# Patient Record
Sex: Male | Born: 2000 | Race: Black or African American | Hispanic: No | Marital: Single | Smoking: Former smoker
Health system: Southern US, Community
[De-identification: ages and names within clinical notes are randomized; demographics above are authoritative.]

## PROBLEM LIST (undated history)

## (undated) DIAGNOSIS — J309 Allergic rhinitis, unspecified: Secondary | ICD-10-CM

## (undated) DIAGNOSIS — F32A Depression, unspecified: Secondary | ICD-10-CM

## (undated) DIAGNOSIS — F319 Bipolar disorder, unspecified: Secondary | ICD-10-CM

## (undated) DIAGNOSIS — M2550 Pain in unspecified joint: Secondary | ICD-10-CM

## (undated) DIAGNOSIS — M549 Dorsalgia, unspecified: Secondary | ICD-10-CM

## (undated) DIAGNOSIS — F419 Anxiety disorder, unspecified: Secondary | ICD-10-CM

## (undated) DIAGNOSIS — F909 Attention-deficit hyperactivity disorder, unspecified type: Secondary | ICD-10-CM

## (undated) DIAGNOSIS — R0602 Shortness of breath: Secondary | ICD-10-CM

## (undated) DIAGNOSIS — Z789 Other specified health status: Secondary | ICD-10-CM

## (undated) DIAGNOSIS — K259 Gastric ulcer, unspecified as acute or chronic, without hemorrhage or perforation: Secondary | ICD-10-CM

## (undated) DIAGNOSIS — K219 Gastro-esophageal reflux disease without esophagitis: Secondary | ICD-10-CM

## (undated) HISTORY — DX: Attention-deficit hyperactivity disorder, unspecified type: F90.9

## (undated) HISTORY — DX: Gastric ulcer, unspecified as acute or chronic, without hemorrhage or perforation: K25.9

## (undated) HISTORY — DX: Allergic rhinitis, unspecified: J30.9

## (undated) HISTORY — DX: Shortness of breath: R06.02

## (undated) HISTORY — DX: Dorsalgia, unspecified: M54.9

## (undated) HISTORY — DX: Other specified health status: Z78.9

## (undated) HISTORY — DX: Depression, unspecified: F32.A

## (undated) HISTORY — DX: Pain in unspecified joint: M25.50

## (undated) HISTORY — DX: Gastro-esophageal reflux disease without esophagitis: K21.9

## (undated) HISTORY — DX: Anxiety disorder, unspecified: F41.9

---
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
## 2018-01-14 ENCOUNTER — Other Ambulatory Visit: Payer: Self-pay | Admitting: Family Medicine

## 2018-03-06 ENCOUNTER — Encounter: Payer: Self-pay | Admitting: Family Medicine

## 2018-03-06 ENCOUNTER — Ambulatory Visit (INDEPENDENT_AMBULATORY_CARE_PROVIDER_SITE_OTHER): Payer: 59 | Admitting: Family Medicine

## 2018-03-06 ENCOUNTER — Telehealth: Payer: Self-pay | Admitting: Family Medicine

## 2018-03-06 VITALS — BP 130/84 | HR 91 | Temp 99.3°F | Ht 72.77 in | Wt 209.8 lb

## 2018-03-06 DIAGNOSIS — J069 Acute upper respiratory infection, unspecified: Secondary | ICD-10-CM

## 2018-03-06 DIAGNOSIS — R509 Fever, unspecified: Secondary | ICD-10-CM

## 2018-03-06 DIAGNOSIS — F3181 Bipolar II disorder: Secondary | ICD-10-CM | POA: Diagnosis not present

## 2018-03-06 LAB — POC INFLUENZA A&B (BINAX/QUICKVUE)
INFLUENZA B, POC: NEGATIVE
Influenza A, POC: NEGATIVE

## 2018-03-06 MED ORDER — BENZONATATE 100 MG PO CAPS: 100.0000 mg | ORAL_CAPSULE | Freq: Two times a day (BID) | ORAL | 0 refills | Status: DC | PRN

## 2018-03-06 NOTE — Telephone Encounter (Signed)
See note  Copied from CRM [id number]. Topic: General - Inquiry >> Mar 06, 2018  1:51 PM Maia Petties wrote: Reason for CRM: pt called stating that his girlfriend, 20 yrs old, has cystic fibrosis and he is asking if it would be possible to find out what type of bacterial infection he may have and if it would be problematic to be around her. Please advise.

## 2018-03-06 NOTE — Patient Instructions (Addendum)
Upper Respiratory infection History and exam today are suggestive of viral infection most likely due to upper respiratory infection. Flu test negative today  Symptomatic treatment with: tessalon perles for cough. I also want you to try to rest in bed over 10 hours per day and try to remain very well hydrated.   We discussed that we did not find any infection that had higher probability of being bacterial such as pneumonia or strep throat.  We discussed signs that bacterial infection may have developed particularly fever or shortness of breath. Likely course of 1-2 weeks. Patient is contagious and advised good handwashing and consideration of mask If going to be in public places.   Finally, we reviewed reasons to return to care including if symptoms worsen or persist (lets say not improving within a week- we should check back in) or new concerns arise- once again particularly shortness of breath or fever.  Since you are going back to [facility] on Thursday- you can also reach out to me by mychart or phone in about a week if not improving and update me with current symptoms- that may ultimately change the plan

## 2018-03-06 NOTE — Progress Notes (Signed)
PCP: Shelva Majestic, MD  Subjective:  Cory [name] is a 18 y.o. year old very pleasant male patient who presents with Upper Respiratory infection symptoms including nasal congestion- clear to yellow color, sore throat but he states from coughing, cough- getting clear to yellow sputum out, sneezing. Some body aches. Also really tired with this. Subjective fevers. n ochills.  -started: 2 days ago, symptoms show no change -previous treatments: mucinex. Tried vicks vapo rub which seemed to help -sick contacts/travel/risks: denies flu exposure.  -Hx of: allergies- still taking his zyrtec.   ROS-denies measured fever, SOB, vomiting or diarrhea, tooth pain  Pertinent Past Medical History-  Patient Active Problem List   Diagnosis Date Noted  . Bipolar II disorder (HCC) 07/25/2016    Priority: High  . ADHD     Priority: High  . Electronic cigarette use     Priority: Medium  . Anxiety     Priority: Medium  . GERD (gastroesophageal reflux disease)     Priority: Low  . Instability of right knee joint 08/06/2016   Medications- reviewed  Current Outpatient Medications  Medication Sig Dispense Refill  . cetirizine (ZYRTEC) 10 MG tablet Take 10 mg by mouth daily with breakfast.    . gabapentin (NEURONTIN) 300 MG capsule Take 1 capsule by mouth at bedtime as needed.  1  . lamoTRIgine (LAMICTAL) 200 MG tablet Take 200 mg by mouth daily.    . montelukast (SINGULAIR) 10 MG tablet Take 10 mg by mouth daily with breakfast.    . omeprazole (PRILOSEC) 20 MG capsule TAKE 1 CAPSULE (20 MG TOTAL) BY MOUTH DAILY WITH BREAKFAST. 30 capsule 0  . traZODone (DESYREL) 50 MG tablet Take 50 mg by mouth at bedtime.    . benzonatate (TESSALON) 100 MG capsule Take 1 capsule (100 mg total) by mouth 2 (two) times daily as needed for cough. 20 capsule 0   No current facility-administered medications for this visit.     Objective: BP 130/84 (BP Location: Left Arm, Patient Position: Sitting, Cuff Size: Large)    Pulse 91   Temp 99.3 F (37.4 C) (Oral)   Ht 6' 0.77" (1.848 m)   Wt 209 lb 12.8 oz (95.2 kg)   SpO2 96%   BMI 27.86 kg/m  Gen: NAD, resting comfortably HEENT: Turbinates erythematous and particularly on right extremely edematous, TM normal, pharynx mildly erythematous with no tonsilar exudate or edema, no sinus tenderness CV: RRR no murmurs rubs or gallops Lungs: CTAB no crackles, wheeze, rhonchi Ext: no edema Skin: warm, dry, no rash Neuro: grossly normal, moves all extremities  Results for orders placed or performed in visit on 03/06/18 (from the past 24 hour(s))  POC Influenza A&B(BINAX/QUICKVUE)     Status: None   Collection Time: 03/06/18 11:10 AM  Result Value Ref Range   Influenza A, POC Negative Negative   Influenza B, POC Negative Negative   Assessment/Plan:  Upper Respiratory infection History and exam today are suggestive of viral infection most likely due to upper respiratory infection. Flu test negative today  Symptomatic treatment with: tessalon perles for cough. I also want you to try to rest in bed over 10 hours per day and try to remain very well hydrated.   Considered prednisone but with psychiatric history of bipolar 2 wanted to reserve this if patient is not improving- he is already not sleeping well with illness and thought prednisone may worsen this.   We discussed that we did not find any infection that had higher  probability of being bacterial such as pneumonia or strep throat.  We discussed signs that bacterial infection may have developed particularly fever or shortness of breath. Likely course of 1-2 weeks. Patient is contagious and advised good handwashing and consideration of mask If going to be in public places.   Finally, we reviewed reasons to return to care including if symptoms worsen or persist (lets say not improving within a week- we should check back in) or new concerns arise- once again particularly shortness of breath or fever.  Since you are  going back to [facility] on Thursday- you can also reach out to me by mychart or phone in about a week if not improving and update me with current symptoms- that may ultimately change the plan.   Meds ordered this encounter  Medications  . benzonatate (TESSALON) 100 MG capsule    Sig: Take 1 capsule (100 mg total) by mouth 2 (two) times daily as needed for cough.    Dispense:  20 capsule    Refill:  0   Tana Conch, MD

## 2018-03-07 NOTE — Telephone Encounter (Signed)
He should avoid contact until illness has resolved. We did NOTdiagnose him with a bacterial infection- instead diagnosed with viral upper respiratory infection

## 2018-03-09 NOTE — Telephone Encounter (Signed)
Called but did not leave a voicemail message as the number listed is the dads telephone number

## 2018-04-25 ENCOUNTER — Other Ambulatory Visit: Payer: Self-pay | Admitting: Family Medicine

## 2018-04-28 ENCOUNTER — Other Ambulatory Visit: Payer: Self-pay

## 2018-06-21 ENCOUNTER — Encounter: Payer: Self-pay | Admitting: Family Medicine

## 2018-06-21 ENCOUNTER — Ambulatory Visit (INDEPENDENT_AMBULATORY_CARE_PROVIDER_SITE_OTHER): Payer: 59 | Admitting: Family Medicine

## 2018-06-21 VITALS — Ht 73.0 in | Wt 210.0 lb

## 2018-06-21 DIAGNOSIS — Z6827 Body mass index (BMI) 27.0-27.9, adult: Secondary | ICD-10-CM

## 2018-06-21 DIAGNOSIS — R110 Nausea: Secondary | ICD-10-CM

## 2018-06-21 DIAGNOSIS — J301 Allergic rhinitis due to pollen: Secondary | ICD-10-CM

## 2018-06-21 DIAGNOSIS — K219 Gastro-esophageal reflux disease without esophagitis: Secondary | ICD-10-CM

## 2018-06-21 DIAGNOSIS — Z789 Other specified health status: Secondary | ICD-10-CM

## 2018-06-21 DIAGNOSIS — J309 Allergic rhinitis, unspecified: Secondary | ICD-10-CM | POA: Insufficient documentation

## 2018-06-21 DIAGNOSIS — E663 Overweight: Secondary | ICD-10-CM

## 2018-06-21 DIAGNOSIS — F3181 Bipolar II disorder: Secondary | ICD-10-CM

## 2018-06-21 MED ORDER — OMEPRAZOLE 20 MG PO CPDR: 20.0000 mg | DELAYED_RELEASE_CAPSULE | Freq: Every day | ORAL | 3 refills | Status: DC

## 2018-06-21 MED ORDER — FLUTICASONE PROPIONATE 50 MCG/ACT NA SUSP: 2.0000 | Freq: Every day | NASAL | 3 refills | Status: DC

## 2018-06-21 NOTE — Patient Instructions (Addendum)
Health Maintenance Due  Topic Date Due  . HIV Screening Pt stated that he is willing to have this done and also willing to postponed for later visit 11/10/2013   Pt did not receive a flu shot this flu season  Video visit

## 2018-06-21 NOTE — Progress Notes (Signed)
Phq2 done! Pt had a high score. Advised pt to f/u with psych per Dr. Erasmo Leventhal request.   Please see Phq-9

## 2018-06-21 NOTE — Progress Notes (Signed)
Elevated PHQ 9 score noted-current COVID-19 pandemic likely contributes in fact he has to be home from college.  With that being said-needs close psychiatry follow-up as recommended by Kendra-I appreciate her calling patient back.   Office Visit from 06/21/2018 in [facility]  PHQ-9 Total Score  12     Tana Conch, MD

## 2018-06-21 NOTE — Assessment & Plan Note (Signed)
S: Patient states bipolar has been reasonably well controlled.  He continues follow-up psychiatry.  He states he has about a week left of medication but needs a refill on the Lamictal and requests a refill from me. A/P: I recommended patient call psychiatry for follow-up-he agrees to do so-he states sometimes can take up to a week to get his medication refilled-I recommended he start on that today. -Due to potential mood issues/blackbox warning with Singulair-we opted to try to discontinue this.

## 2018-06-21 NOTE — Progress Notes (Signed)
Phone [phone]   Subjective:  Virtual visit via Video note. Chief complaint: Chief Complaint  Patient presents with  . Nausea    Pt stated that he has been having nausea and head aches  . Medication Refill    Lamactil this was not prescribed by you    This visit type was conducted due to national recommendations for restrictions regarding the COVID-19 Pandemic (e.g. social distancing).  This format is felt to be most appropriate for this patient at this time balancing risks to patient and risks to population by having him in for in person visit.  No physical exam was performed (except for noted visual exam or audio findings with Telehealth visits).    Our team/I connected with Cory [name] on 06/21/18 at 11:40 AM EDT by a video enabled telemedicine application ([email or web address]) and verified that I am speaking with the correct person using two identifiers.  Location patient: Home[place] Location provider: [facility], office Persons participating in the virtual visit:  patient  Our team/I discussed the limitations of evaluation and management by telemedicine and the availability of in person appointments. In light of current covid-19 pandemic, patient also understands that we are trying to protect them by minimizing in office contact if at all possible.  The patient expressed consent for telemedicine visit and agreed to proceed. Patient understands insurance will be billed.   ROS-no fever, chills, vomiting.  Has had some nausea and upper abdominal discomfort/chest burning particular after meals.  Past Medical History-  Patient Active Problem List   Diagnosis Date Noted  . Bipolar II disorder (H[facility]) 07/25/2016    Priority: High  . ADHD     Priority: High  . Electronic cigarette use     Priority: Medium  . Anxiety     Priority: Medium  . GERD (gastroesophageal reflux disease)     Priority: Low  . Allergic rhinitis 06/21/2018  . Instability of right knee joint 08/06/2016    Medications-  reviewed and updated Current Outpatient Medications  Medication Sig Dispense Refill  . cetirizine (ZYRTEC) 10 MG tablet Take 10 mg by mouth daily with breakfast.    . gabapentin (NEURONTIN) 300 MG capsule Take 1 capsule by mouth at bedtime as needed.  1  . lamoTRIgine (LAMICTAL) 200 MG tablet Take 200 mg by mouth daily.    . montelukast (SINGULAIR) 10 MG tablet TAKE 1 TABLET (10 MG TOTAL) BY MOUTH DAILY. 90 tablet 0  . omeprazole (PRILOSEC) 20 MG capsule Take 1 capsule (20 mg total) by mouth daily with breakfast. 90 capsule 3  . traZODone (DESYREL) 50 MG tablet Take 50 mg by mouth at bedtime.    . fluticasone (FLONASE) 50 MCG/ACT nasal spray Place 2 sprays into both nostrils daily. 48 g 3   No current facility-administered medications for this visit.      Objective:  Ht 6\' 1"  (1.854 m)   Wt 210 lb (95.3 kg)   BMI 27.71 kg/m  Gen: NAD, resting comfortably-does appear sleepy-yawns during visit Lungs: nonlabored, normal respiratory rate  Skin: appears dry, no obvious rash    Assessment and Plan   #GERD/nausea S:Patient states over last 1.5 weeks has been getting very nauseous on and off and then over last few days has been almost constantly nauseous. If he lays still seems to get better. When acid reflux was bad when he was younger it would cause similar issues- would throw up.   Also having some headaches for last month.  Possible contributing factor-crunch time with  school- exams coming up- 28th and 5th.   Has not refilled his omeprazole - stopped 2-3 weeks ago.  Usually takes before dinner A/P: Given the fact that nausea started almost 2 weeks ago when patient stopped his omeprazole 2 to 3 weeks ago- in addition to the fact that in the past his GERD has presented with nausea and current symptoms seem to be worse after meals-I recommended he restart the omeprazole as GERD could be the cause of his symptoms-he states he needs a refill.  We refill this today. -Recommended 3-4-week  follow-up if not improving-sooner if worsens  #Overweight S: Patient states he had gotten all the way down to 195 but has gone back up to 210-he is exercising regularly but admits to eating poorly with sandwiches/pasta/a lot of carbohydrates.  Compared to last check here weight is only up 1 pound.  He asks about weight loss medication. A/P: Instead of starting weight loss medication-I recommended patient improve upon his diet-particularly with increasing fruits and vegetables and cutting down on heavy carbohydrate load.  He agrees to work on this as well as portion size.   Bipolar II disorder (HCC) S: Patient states bipolar has been reasonably well controlled.  He continues follow-up psychiatry.  He states he has about a week left of medication but needs a refill on the Lamictal and requests a refill from me. A/P: I recommended patient call psychiatry for follow-up-he agrees to do so-he states sometimes can take up to a week to get his medication refilled-I recommended he start on that today. -Due to potential mood issues/blackbox warning with Singulair-we opted to try to discontinue this.  Electronic cigarette use S: Continues to use e-cigarettes. A/P: I recommended patient discontinue use-particularly in light of COVID-19 pandemic-he states he will try to cut back/quit  Allergic rhinitis S: Patient states allergies have been well controlled on Singulair and Zyrtec. A/P: Due to potential mood altering symptoms of Singulair I recommended he stop this in light of bipolar.  Continue Zyrtec.  I also sent in Flonase for him if symptoms are not adequately controlled on Zyrtec alone.   Lab/Order associations: Gastroesophageal reflux disease without esophagitis  Bipolar II disorder (HCC)  Electronic cigarette use  Overweight  BMI 27.0-27.9,adult  Seasonal allergic rhinitis due to pollen  Meds ordered this encounter  Medications  . omeprazole (PRILOSEC) 20 MG capsule    Sig: Take 1 capsule  (20 mg total) by mouth daily with breakfast.    Dispense:  90 capsule    Refill:  3  . fluticasone (FLONASE) 50 MCG/ACT nasal spray    Sig: Place 2 sprays into both nostrils daily.    Dispense:  48 g    Refill:  3   Return precautions advised.  Tana Conch, MD

## 2018-06-21 NOTE — Assessment & Plan Note (Signed)
S: Patient states allergies have been well controlled on Singulair and Zyrtec. A/P: Due to potential mood altering symptoms of Singulair I recommended he stop this in light of bipolar.  Continue Zyrtec.  I also sent in Flonase for him if symptoms are not adequately controlled on Zyrtec alone.

## 2018-06-21 NOTE — Assessment & Plan Note (Signed)
S: Continues to use e-cigarettes. A/P: I recommended patient discontinue use-particularly in light of COVID-19 pandemic-he states he will try to cut back/quit

## 2018-10-25 ENCOUNTER — Other Ambulatory Visit: Payer: Self-pay

## 2018-10-25 MED ORDER — MONTELUKAST SODIUM 10 MG PO TABS: ORAL_TABLET | ORAL | 0 refills | Status: DC

## 2019-03-08 ENCOUNTER — Telehealth: Payer: Self-pay | Admitting: Family Medicine

## 2019-03-08 NOTE — Telephone Encounter (Signed)
Patients mother wanted to know where her son can get tested for COVID, she said he doesn't have any symptoms but is needing to return to school. She said that he needs to be tested before Thursday.

## 2019-03-09 NOTE — Telephone Encounter (Signed)
Left message to return call to our office.  

## 2019-03-14 NOTE — Telephone Encounter (Signed)
Called patient several times not able to reach will close message has been over 5 days with no call back

## 2019-03-15 ENCOUNTER — Telehealth: Payer: Self-pay | Admitting: Family Medicine

## 2019-03-15 NOTE — Telephone Encounter (Signed)
Mother calls in saying that she had several missed calls on the home phone about returning a call about her son being tested so he can go back to school. She said she had thought her sons phone number was in the system but she asked if someone could give him a call to advise him of where to go for testing.  Phone number:[phone]

## 2019-03-15 NOTE — Telephone Encounter (Signed)
Called and gave all information to patient on when and how to get tested

## 2019-03-16 ENCOUNTER — Telehealth: Payer: Self-pay

## 2019-03-16 ENCOUNTER — Telehealth: Payer: Self-pay | Admitting: Family Medicine

## 2019-03-16 NOTE — Telephone Encounter (Signed)
Please advise 

## 2019-03-16 NOTE — Telephone Encounter (Signed)
error 

## 2019-03-16 NOTE — Telephone Encounter (Signed)
I do not have power over screening protocols. I am pretty sure even without symptoms [facility] He needs to call our testing center.   Honestly the best thing is to presume this is actually positive - he needs to quarantine for 10 days as long as remains symptom free. Any close contacts need to quarantine 14 days from last contact with him in that 10 day interval.

## 2019-03-16 NOTE — Telephone Encounter (Signed)
Called patient he states that he just had second Covid test. Will call if any questions. Also let him know that we Do not have DPR he will need to fill out if we are able to talk to parents in the future.

## 2019-03-16 NOTE — Telephone Encounter (Signed)
Patients father called in saying his son was tested for COVID so he could go back to school this Sunday, but they did a rapid covid test and ended up with a positive. Did covid screening with father and he answered no to all but thinks it could be a false positive and wanted to know what to do in order to get their son back to school.

## 2019-03-16 NOTE — Telephone Encounter (Signed)
Patient mother called earlier stating that they are trying to get their son tested for covid so he could return to school. Mother stated that that their son haven't been exposed to covid and wasn't having any symptoms. I advised mother if son needed to be test they could sign up on cone [email or web address] or he could be visit a [facility]. Mother stated that son answer no to all the questions  and they will not offer the test to the patient. Patient father just called in again requesting to speak with someone regarding this issue. Father has a completely different story from what the mother provided. Please see notes.

## 2019-12-31 ENCOUNTER — Encounter (HOSPITAL_COMMUNITY): Payer: Self-pay | Admitting: Emergency Medicine

## 2019-12-31 ENCOUNTER — Emergency Department (HOSPITAL_COMMUNITY)
Admission: EM | Admit: 2019-12-31 | Discharge: 2019-12-31 | Discharge status: Against Medical Advice | Payer: No Typology Code available for payment source | Attending: Emergency Medicine | Admitting: Emergency Medicine

## 2019-12-31 DIAGNOSIS — F1590 Other stimulant use, unspecified, uncomplicated: Secondary | ICD-10-CM | POA: Diagnosis not present

## 2019-12-31 DIAGNOSIS — F339 Major depressive disorder, recurrent, unspecified: Secondary | ICD-10-CM | POA: Diagnosis not present

## 2019-12-31 DIAGNOSIS — Z20822 Contact with and (suspected) exposure to covid-19: Secondary | ICD-10-CM | POA: Diagnosis not present

## 2019-12-31 DIAGNOSIS — Z046 Encounter for general psychiatric examination, requested by authority: Secondary | ICD-10-CM | POA: Diagnosis not present

## 2019-12-31 DIAGNOSIS — F17210 Nicotine dependence, cigarettes, uncomplicated: Secondary | ICD-10-CM | POA: Diagnosis not present

## 2019-12-31 LAB — CBC
HCT: 48.1 % (ref 39.0–52.0)
Hemoglobin: 16 g/dL (ref 13.0–17.0)
MCH: 28.9 pg (ref 26.0–34.0)
MCHC: 33.3 g/dL (ref 30.0–36.0)
MCV: 87 fL (ref 80.0–100.0)
Platelets: 273 10*3/uL (ref 150–400)
RBC: 5.53 MIL/uL (ref 4.22–5.81)
RDW: 12.8 % (ref 11.5–15.5)
WBC: 5.4 10*3/uL (ref 4.0–10.5)
nRBC: 0 % (ref 0.0–0.2)

## 2019-12-31 LAB — COMPREHENSIVE METABOLIC PANEL
ALT: 66 U/L — ABNORMAL HIGH (ref 0–44)
AST: 29 U/L (ref 15–41)
Albumin: 4.1 g/dL (ref 3.5–5.0)
Alkaline Phosphatase: 88 U/L (ref 38–126)
Anion gap: 14 (ref 5–15)
BUN: 16 mg/dL (ref 6–20)
CO2: 23 mmol/L (ref 22–32)
Calcium: 9.7 mg/dL (ref 8.9–10.3)
Chloride: 107 mmol/L (ref 98–111)
Creatinine, Ser: 1.02 mg/dL (ref 0.61–1.24)
GFR, Estimated: >60 mL/min (ref >60)
Glucose, Bld: 100 mg/dL — ABNORMAL HIGH (ref 70–99)
Potassium: 4 mmol/L (ref 3.5–5.1)
Sodium: 144 mmol/L (ref 135–145)
Total Bilirubin: 0.6 mg/dL (ref 0.3–1.2)
Total Protein: 7.1 g/dL (ref 6.5–8.1)

## 2019-12-31 LAB — RESPIRATORY PANEL BY RT PCR (FLU A&B, COVID)
Influenza A by PCR: NEGATIVE
Influenza B by PCR: NEGATIVE
SARS Coronavirus 2 by RT PCR: NEGATIVE

## 2019-12-31 LAB — SALICYLATE LEVEL: Salicylate Lvl: <7 mg/dL — ABNORMAL LOW (ref 7.0–30.0)

## 2019-12-31 LAB — RAPID URINE DRUG SCREEN, HOSP PERFORMED
Amphetamines: NOT DETECTED
Barbiturates: NOT DETECTED
Benzodiazepines: NOT DETECTED
Cocaine: NOT DETECTED
Opiates: NOT DETECTED
Tetrahydrocannabinol: POSITIVE — AB

## 2019-12-31 LAB — ETHANOL: Alcohol, Ethyl (B): <10 mg/dL (ref <10)

## 2019-12-31 LAB — ACETAMINOPHEN LEVEL: Acetaminophen (Tylenol), Serum: <10 ug/mL — ABNORMAL LOW (ref 10–30)

## 2019-12-31 NOTE — ED Notes (Signed)
Pt provided with a sandwich and water

## 2019-12-31 NOTE — ED Notes (Signed)
Patient states that he does not want to wait for TTS. He states that he is voluntary and is deciding to leave. He was encouraged to stay and the process was reexplained to him. He states that he wants to go home and lay down. Continues to deny SI/HI. PA aware that patient left.

## 2019-12-31 NOTE — ED Notes (Signed)
TTS cart in room 

## 2019-12-31 NOTE — ED Triage Notes (Signed)
Pt states that he needs a stay in the psych ward to make sure he stays on the right track. States that he wants to get regulated on his medications for bipolar disorder. He denies SI at this time, but states that he has been in the past and doesn't want to go back to that. He is calm and cooperative.

## 2019-12-31 NOTE — ED Provider Notes (Signed)
[facility] DEPT Provider Note   CSN: [id number] Arrival date & time: 12/31/19  0032     History Chief Complaint  Patient presents with  . Psychiatric Evaluation    Cory [name] is a 19 y.o. male with a history of bipolar disorder, anxiety, vaping, and ADHD who presents to the emergency department with a chief complaint of worsening depression.  The patient reports that he has been very depressed for the last 6 months.  This has been waxing and waning in severity.  He reports that he was suicidal a couple of weeks ago, but this has seemed to resolve.  He denies HI or auditory visual hallucinations.  He reports that he has had multiple life stressors in many areas of his life over the last 6 months that have been contributing to his symptoms.  He reports compliance with his home medications.  He is concerned that despite taking his home medications that his symptoms continue to cause him distress.  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  He endorses marijuana and tobacco use, which he vapes.  No other illicit or recreational substance use.  He does not drink alcohol.  He denies chest pain, shortness of breath, nausea, vomiting, diarrhea, fever, chills, headache.  The history is provided by the patient and medical records. No language interpreter was used.       Past Medical History:  Diagnosis Date  . ADHD    diagnosed by Pediatrician [facility]. record in epic. Vyvanse 90m but also on anxiety medicine and using marijuana- likely should be prescribed by psychiatry  . Allergic rhinitis    zyrtec and singulair  . Anxiety    [facility] referred to peds psychiatyr. he reports vsiit 07/22/16 planned. buspirone  .  Bipolar 1 disorder ([place]   . Electronic cigarette use    advised against using these  . GERD (gastroesophageal reflux disease)    prilosec 283m   Patient Active Problem List   Diagnosis Date Noted  . Allergic rhinitis 06/21/2018  . 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 Bipolar II disorder ([place]05/27/2018  . ADHD   . GERD (gastroesophageal reflux disease)   . Electronic cigarette use   . Anxiety     History reviewed. No pertinent surgical history.     Family History  Problem Relation Age of Onset  . Hyperlipidemia Mother   . Kidney disease Father        renal transplant  . Heart disease Father        CAD age 21. Diabetes Father   . Benign prostatic hyperplasia Father   . Healthy Sister   . Healthy Brother   . Cancer Maternal Uncle        ? colon  . Lung cancer Paternal Aunt        smoker  . Kidney disease Paternal Uncle        dialysis  . Cancer Maternal Grandmother        ? stomach  . Dementia Paternal Grandmother   . Kidney disease Paternal Grandfather   . Other Maternal Grandfather        unknown- never met  . Healthy Sister     Social History  Tobacco Use  . Smoking status: Current Every Day Smoker    Types: Cigarettes  . Smokeless tobacco: Never Used  . Tobacco comment: 1 to 2 cigarettes a day   Vaping Use  . Vaping Use: Former  Substance Use Topics  . Alcohol use: No  . Drug use: Yes    Types: Marijuana    Home Medications Prior to Admission medications   Medication Sig Start Date End Date Taking? Authorizing Provider  cetirizine (ZYRTEC) 10 MG tablet Take 10 mg by mouth daily with breakfast.    [provider]  fluticasone (FLONASE) 50 MCG/ACT nasal spray Place 2 sprays into both nostrils daily. 06/21/18   Marin Olp, MD  gabapentin (NEURONTIN) 300 MG capsule Take 1 capsule by mouth at bedtime as needed. 08/13/16   [provider]  lamoTRIgine (LAMICTAL) 200 MG tablet Take 200 mg by mouth daily.     [provider]  montelukast (SINGULAIR) 10 MG tablet TAKE 1 TABLET (10 MG TOTAL) BY MOUTH DAILY. 10/25/18   Marin Olp, MD  omeprazole (PRILOSEC) 20 MG capsule Take 1 capsule (20 mg total) by mouth daily with breakfast. 06/21/18   Marin Olp, MD  traZODone (DESYREL) 50 MG tablet Take 50 mg by mouth at bedtime.    [provider]    Allergies    Abilify [aripiprazole]  Review of Systems   Review of Systems  Constitutional: Negative for appetite change, chills and fever.  HENT: Negative for congestion and sore throat.   Eyes: Negative for visual disturbance.  Respiratory: Negative for shortness of breath.   Cardiovascular: Negative for chest pain.  Gastrointestinal: Negative for abdominal pain, blood in stool, diarrhea, nausea and vomiting.  Genitourinary: Negative for dysuria.  Musculoskeletal: Negative for arthralgias, back pain, myalgias and neck pain.  Skin: Negative for rash.  Allergic/Immunologic: Negative for immunocompromised state.  Neurological: Negative for dizziness, seizures, syncope, weakness and headaches.  Psychiatric/Behavioral: Positive for suicidal ideas. Negative for confusion, hallucinations and self-injury. The patient is nervous/anxious.     Physical Exam Updated Vital Signs BP (!) 151/85 (BP Location: Right Arm)   Pulse 61   Temp 98.1 F (36.7 C) (Oral)   Resp 16   SpO2 96%   Physical Exam Vitals and nursing note reviewed.  Constitutional:      General: He is not in acute distress.    Appearance: He is well-developed. He is not ill-appearing, toxic-appearing or diaphoretic.  HENT:     Head: Normocephalic.     Mouth/Throat:     Mouth: Mucous membranes are moist.     Pharynx: No oropharyngeal exudate or posterior oropharyngeal erythema.  Eyes:     Conjunctiva/sclera: Conjunctivae normal.  Cardiovascular:     Rate and Rhythm: Normal rate and regular rhythm.     Heart sounds: No murmur heard.   Pulmonary:     Effort:  Pulmonary effort is normal. No respiratory distress.     Breath sounds: No stridor. No wheezing, rhonchi or rales.  Chest:     Chest wall: No tenderness.  Abdominal:     General: There is no distension.     Palpations: Abdomen is soft. There is no mass.     Tenderness: There is no abdominal tenderness. There is no right CVA tenderness, left CVA tenderness, guarding or rebound.     Hernia: No hernia is present.  Musculoskeletal:     Cervical back: Neck supple.     Right lower leg: No edema.  Left lower leg: No edema.  Skin:    General: Skin is warm and dry.  Neurological:     Mental Status: He is alert.  Psychiatric:        Attention and Perception: He does not perceive auditory or visual hallucinations.        Mood and Affect: Mood is depressed.        Speech: Speech normal.        Behavior: Behavior normal. Behavior is cooperative.        Thought Content: Thought content does not include homicidal or suicidal ideation. Thought content does not include homicidal or suicidal plan.     ED Results / Procedures / Treatments   Labs (all labs ordered are listed, but only abnormal results are displayed) Labs Reviewed  COMPREHENSIVE METABOLIC PANEL - Abnormal; Notable for the following components:      Result Value   Glucose, Bld 100 (*)    ALT 66 (*)    All other components within normal limits  SALICYLATE LEVEL - Abnormal; Notable for the following components:   Salicylate Lvl <1.9 (*)    All other components within normal limits  ACETAMINOPHEN LEVEL - Abnormal; Notable for the following components:   Acetaminophen (Tylenol), Serum <10 (*)    All other components within normal limits  RAPID URINE DRUG SCREEN, HOSP PERFORMED - Abnormal; Notable for the following components:   Tetrahydrocannabinol POSITIVE (*)    All other components within normal limits  RESPIRATORY PANEL BY RT PCR (FLU A&B, COVID)  ETHANOL  CBC    EKG None  Radiology No results  found.  Procedures Procedures (including critical care time)  Medications Ordered in ED Medications - No data to display  ED Course  I have reviewed the triage vital signs and the nursing notes.  Pertinent labs & imaging results that were available during my care of the patient were reviewed by me and considered in my medical decision making (see chart for details).    MDM Rules/Calculators/A&P                          19 year old male with a history of depression who presents with worsening depression over the last 6 months and intermittent SI.  He has not had suicidal ideation for the last 2 to 3 weeks.  He denies HI or AVN.  He has been taking his home medications, but is concerned that he may need medication management as his depression has been waxing and waning in intensity over the last 6 months.  Labs have been ordered, reviewed, and independently evaluated by me. UDS is positive for THC.  Lab findings are otherwise reassuring.  Pt medically cleared at this time.  TTS consult pending; please see psych team notes for further documentation of care/dispo. Pt stable at time of med clearance.   Unfortunately, after TTS consult was ordered, I was notified by RN that patient had eloped from the department.  Unfortunately, I was unable to give him ER return precautions prior to elopement.  However, he was not actively suicidal nor had he been for the last few weeks.  He does not need to be IVC at this time.  He can follow-up with his outpatient behavioral health team.      Final Clinical Impression(s) / ED Diagnoses Final diagnoses:  Episode of recurrent major depressive disorder, unspecified depression episode severity ([place])    Rx / DC Orders ED Discharge Orders  None       Joanne Gavel, PA-C 12/31/19 [id number]    Shanon Rosser, MD 12/31/19 2241

## 2019-12-31 NOTE — ED Notes (Signed)
Mother called with concern as to wait last night. Patient experience number given and provided information also about [facility]. She was very Teacher, English as a foreign language listening to her concerns.

## 2020-06-30 ENCOUNTER — Ambulatory Visit (INDEPENDENT_AMBULATORY_CARE_PROVIDER_SITE_OTHER): Payer: No Typology Code available for payment source | Admitting: Family Medicine

## 2020-06-30 ENCOUNTER — Other Ambulatory Visit: Payer: Self-pay

## 2020-06-30 ENCOUNTER — Ambulatory Visit: Payer: Self-pay

## 2020-06-30 ENCOUNTER — Ambulatory Visit (INDEPENDENT_AMBULATORY_CARE_PROVIDER_SITE_OTHER): Payer: No Typology Code available for payment source

## 2020-06-30 VITALS — BP 126/84 | HR 81 | Ht 73.0 in | Wt 235.0 lb

## 2020-06-30 DIAGNOSIS — M25511 Pain in right shoulder: Secondary | ICD-10-CM

## 2020-06-30 DIAGNOSIS — G8929 Other chronic pain: Secondary | ICD-10-CM | POA: Diagnosis not present

## 2020-06-30 NOTE — Patient Instructions (Addendum)
Health Maintenance Due  Topic Date Due  . HIV Screening - reports had at urgent care Never done  . COVID-19 Vaccine (3 - Booster for [employer] series)- send [place] a copy or drop off dates of covid vaccine 04/09/2020   Patient with history of GERD with no recent symptoms.  He did have an episode of hematemesis approximately 2 weeks ago and was seen in urgent care-reports reassuring blood work at that time.  We are going to try to get a copy of blood work and see what their assessment was at that time.  Thankfully he has not had any more symptoms despite not taking Carafate or famotidine.  We are going to monitor for now and get copy of records- if any recurrent symptoms we discussed referral to GI.  I want him to avoid taking the delta 8 as that seem to irritate his system.  If he notes blood in stool, any more blood in throw up, or dark black stool- needs GI referral and immediate check up  We will call you within two weeks about your referral to nutrition. If you do not hear within 2 weeks, give [place] a call.   Sign release of information at the check out desk for information from urgent care   Recommended follow up: keep July physical

## 2020-06-30 NOTE — Progress Notes (Signed)
Phone [phone] In person visit   Subjective:   Cory [name] is a 20 y.o. year old very pleasant male patient who presents for/with See problem oriented charting Chief Complaint  Patient presents with  . GI Problem    Patient states that he recently has an episode where he was throwing up blood and his stomach hasn't been the same since. Last Thursday or the Thursday prior he can not remember.    This visit occurred during the SARS-CoV-2 public health emergency.  Safety protocols were in place, including screening questions prior to the visit, additional usage of staff PPE, and extensive cleaning of exam room while observing appropriate contact time as indicated for disinfecting solutions.   Past Medical History-  Patient Active Problem List   Diagnosis Date Noted  . Bipolar II disorder (HCC) 07/25/2016    Priority: High  . ADHD     Priority: High  . Electronic cigarette use     Priority: Medium  . Anxiety     Priority: Medium  . GERD (gastroesophageal reflux disease)     Priority: Low  . Allergic rhinitis 06/21/2018  . 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 lamoTRIgine (LAMICTAL) 200 MG tablet Take 150 mg by mouth 2 (two) times daily.    . pramipexole (MIRAPEX) 1 MG tablet     . cetirizine (ZYRTEC) 10 MG tablet Take 10 mg by mouth daily with breakfast. (Patient not taking: Reported on 07/01/2020)    . fluticasone (FLONASE) 50 MCG/ACT nasal spray Place 2 sprays into both nostrils daily. (Patient not taking: Reported on 07/01/2020) 48 g 3   No current facility-administered medications for this visit.     Objective:  BP 130/86   Pulse 83   Temp 98.1 F (36.7 C) (Temporal)   Ht 6\' 1"  (1.854 m)   Wt 239 lb 3.2 oz (108.5 kg)   SpO2 97%   BMI 31.56 kg/m  Gen: NAD, resting comfortably TM normal bilaterally, nares swollen with clear rhinorrhea CV: RRR no murmurs rubs  or gallops Lungs: CTAB no crackles, wheeze, rhonchi Abdomen: soft/nontender/nondistended/normal bowel sounds. No rebound or guarding.  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  We restarted omeprazole at that time and recommended 3 to 4-week follow-up-it appears he was lost to follow-up  Patient reports today, had an episode of hematemesis on April 21st into the toilet- covered about half the toilet. He took delta 8- cbd variant instead of trazodone and feels like took too much making him nauseous. No more episodes since that time. No melena or BRBPR  He went to urgent care and they did blood work. They placed him on 2 medicines but he has not picked up yet-started on carafate 1g BID and famotidine 20 mg BID. He had nausea after the exam which was 2 days after visit. No recent nsaids. Denies any recent reflux symptoms -also did std screening while there which was encouraging A/P: Patient with history of GERD with no recent symptoms.  He did have an episode of hematemesis approximately 2 weeks ago and was seen in urgent care-reports reassuring blood work at that time.  We are going to try to get a copy of blood work and see what their assessment was at that time.  Thankfully he has not had any more symptoms despite not taking Carafate or famotidine.  We are going to monitor for now and get copy of records- if any recurrent symptoms we discussed referral to GI.  I want him to avoid taking the delta 8 as that seem to irritate his system.  If he notes blood in stool, any more blood in throw up, or dark black stool- needs GI referral and immediate check up  #Bipolar II- continues to be managed by psychiatry on lamictal  150mg  BID with pschiatry. States trazodone was not helpful. [facility] [place]. He denies any thoughts of self harm. PHQ9 somewhat elevated and encouraged ofllow up  # Obesity S:was eating out a lot in college- left in December and tried to stay in area and was eating out a lot still  Wt Readings from Last 3 Encounters:  07/01/20 239 lb 3.2 oz (108.5 kg)  06/30/20 235 lb (106.6 kg)  06/21/18 210 lb (95.3 kg) (95 %, Z= 1.66)*   * Growth percentiles are based on CDC (Boys, 2-20 Years) data.  A/P: patient would like referral to nutrition to help him with weight loss. I also recommended healthy eating with calorie counting- myfitnesspal and regular exercise   # Left ear pain S: since last night has started with some sharp ear pains. Some allergies recently A/P:  pain could be coming from inner ear- would recommend restarting otc allergy meds and if worsens or faisl to improve follow up with 06/23/18  #smoking- 1-2 on occasion-  strongly recommended completely  Recommended follow up: keep cpe July  Future Appointments  Date Time Provider Department Center  09/22/2020  9:20 AM 09/24/2020, MD [facility]    Lab/Order associations:   ICD-10-CM   1. Gastroesophageal reflux disease without esophagitis  K21.9   2. Hematemesis with nausea  K92.0   3. Bipolar II disorder (HCC)  F31.81   4. Obesity (BMI 30-39.9)  E66.9 Amb ref to Medical Nutrition Therapy-[facility]   Time Spent: 34 minutes of total time (11:03 AM- 11:37 AM) was spent on the date of the encounter performing the following actions: chart review prior to seeing the patient, obtaining history, performing a medically necessary exam, counseling on the treatment plan, placing orders, and documenting in our EHR.   Return precautions advised.  Shelva Majestic, MD

## 2020-06-30 NOTE — Patient Instructions (Addendum)
Thank you for coming in today.  I've referred you to Physical Therapy.  Let us know if you don't hear from them in one week.  Recheck in 6 weeks.   Let me know if this is not working.   [facility] Address: [address] [place], [place], [place] [address] Phone: [phone]  Weight loss people.

## 2020-06-30 NOTE — Progress Notes (Signed)
    Subjective:    CC: Right shoulder pain  I, Christoper Fabian, LAT, ATC, am serving as scribe for Dr. Clementeen Graham.  HPI: Pt is a 20 y/o male presenting w/ R shoulder pain since 2015. MOI: Pt was in a wrestling match and shoulder gave out on him. Pt never got the injury treated. Pt has decrease AROM in shoulder IR. He locates his pain to the anterior aspect of R shoulder.  Additionally he notes that he has had some weight gain over the last several years and is having trouble getting the weight off and he would like some medical help with this if possible.  He is interested in [place] healthy weight and wellness if able.  Radiating pain: no Shoulder mechanical symptoms: yes Aggravating factors: push ups, sleeping on R-side, overhead Treatments tried: nothing  Diagnostic imaging: R shoulder XR- 08/12/16  Pertinent review of Systems: No fevers or chills  Relevant historical information: Bipolar disorder   Objective:    Vitals:   06/30/20 1318  BP: 126/84  Pulse: 81  SpO2: 96%  Body mass index is 31 kg/m.  General: Well Developed, well nourished, and in no acute distress.   MSK: Right shoulder normal-appearing  Range of motion abduction full external rotation full internal rotation posterior iliac crest. Contralateral left shoulder internal rotation to thoracic spine. Strength intact. Mildly positive Hawkins and Neer's test. Positive apprehension test. Positive O'Brien test. Minimally positive clunk test.   Lab and Radiology Results  X-ray images right shoulder obtained today personally and independently interpreted. No fractures visible.  No significant degenerative changes. Await formal radiology review   Impression and Recommendations:    Assessment and Plan: 20 y.o. male with chronic right shoulder pain and dysfunction after what sounds like a subluxation event occurring 7 years ago or wrestling.  Patient has mechanical symptoms and positive exam findings  concerning for labrum issue.  Plan for trial of conservative management with physical therapy.  Recheck back in 6 weeks.  If not improved next step would be MRI arthrogram..  PDMP not reviewed this encounter. Orders Placed This Encounter  Procedures  . DG Shoulder Right    Standing Status:   Future    Number of Occurrences:   1    Standing Expiration Date:   06/30/2021    Order Specific Question:   Reason for Exam (SYMPTOM  OR DIAGNOSIS REQUIRED)    Answer:   chronic right shoulder pain    Order Specific Question:   Preferred imaging location?    Answer:   [facility]  . Ambulatory referral to Physical Therapy    Referral Priority:   Routine    Referral Type:   Physical Medicine    Referral Reason:   Specialty Services Required    Requested Specialty:   Physical Therapy   No orders of the defined types were placed in this encounter.   Discussed warning signs or symptoms. Please see discharge instructions. Patient expresses understanding.   The above documentation has been reviewed and is accurate and complete Clementeen Graham, M.D.

## 2020-07-01 ENCOUNTER — Ambulatory Visit (INDEPENDENT_AMBULATORY_CARE_PROVIDER_SITE_OTHER): Payer: No Typology Code available for payment source | Admitting: Family Medicine

## 2020-07-01 ENCOUNTER — Encounter: Payer: Self-pay | Admitting: Family Medicine

## 2020-07-01 VITALS — BP 130/86 | HR 83 | Temp 98.1°F | Ht 73.0 in | Wt 239.2 lb

## 2020-07-01 DIAGNOSIS — K920 Hematemesis: Secondary | ICD-10-CM | POA: Diagnosis not present

## 2020-07-01 DIAGNOSIS — K219 Gastro-esophageal reflux disease without esophagitis: Secondary | ICD-10-CM | POA: Diagnosis not present

## 2020-07-01 DIAGNOSIS — F3181 Bipolar II disorder: Secondary | ICD-10-CM

## 2020-07-01 DIAGNOSIS — E669 Obesity, unspecified: Secondary | ICD-10-CM | POA: Diagnosis not present

## 2020-07-01 NOTE — Progress Notes (Signed)
Right shoulder x-ray looks normal to radiology

## 2020-07-07 ENCOUNTER — Other Ambulatory Visit: Payer: Self-pay

## 2020-07-07 ENCOUNTER — Encounter: Payer: Self-pay | Admitting: Physical Therapy

## 2020-07-07 ENCOUNTER — Ambulatory Visit (INDEPENDENT_AMBULATORY_CARE_PROVIDER_SITE_OTHER): Payer: No Typology Code available for payment source | Admitting: Physical Therapy

## 2020-07-07 DIAGNOSIS — M62838 Other muscle spasm: Secondary | ICD-10-CM | POA: Diagnosis not present

## 2020-07-07 DIAGNOSIS — G8929 Other chronic pain: Secondary | ICD-10-CM

## 2020-07-07 DIAGNOSIS — M6281 Muscle weakness (generalized): Secondary | ICD-10-CM

## 2020-07-07 DIAGNOSIS — M25511 Pain in right shoulder: Secondary | ICD-10-CM | POA: Diagnosis not present

## 2020-07-07 NOTE — Patient Instructions (Signed)
Access Code: [facility] URL: [email or web address] Date: 07/07/2020 Prepared by: Sedalia Muta  Exercises Hooklying Shoulder Flexion Extension AROM on Foam Roll - 1 x daily - 2 sets - 10 reps Sidelying Shoulder External Rotation - 1 x daily - 2 sets - 10 reps Seated Scapular Retraction - 1 x daily - 2 sets - 10 reps

## 2020-07-07 NOTE — Therapy (Signed)
[facility] [address] [place], [place], [address] Phone: [phone]   Fax:  [phone]  Physical Therapy Evaluation  Patient Details  Name: Cory [name] MRN: [id number] Date of Birth: 28-Dec-2000 Referring Provider (PT): Clementeen Graham, MD   Encounter Date: 07/07/2020   PT End of Session - 07/07/20 1522    Visit Number 1    Number of Visits 12    Date for PT Re-Evaluation 08/18/20    Authorization Type Aetna    PT Start Time 1425    PT Stop Time 1505    PT Time Calculation (min) 40 min    Activity Tolerance Patient tolerated treatment well    Behavior During Therapy [facility] for tasks assessed/performed           Past Medical History:  Diagnosis Date  . ADHD    diagnosed by Pediatrician [facility]. record in epic. Vyvanse 70mg  but also on anxiety medicine and using marijuana- likely should be prescribed by psychiatry  . Allergic rhinitis    zyrtec and singulair  . Anxiety    [facility] referred to peds psychiatyr. he reports vsiit 07/22/16 planned. buspirone  . Bipolar 1 disorder (HCC)   . Electronic cigarette use    advised against using these  . GERD (gastroesophageal reflux disease)    prilosec 20mg     History reviewed. No pertinent surgical history.  There were no vitals filed for this visit.    Subjective Assessment - 07/07/20 1424    Subjective Pts reports sharp pain in his R shoulder that has persisted for 7 years after a wrestling injury. Pt did not receive any treatment fo shoulder after injury. Pt reports he cannot flex his R shoulder for >30 sec without pain. Pt has had previous PT for knee/hip pain. Pt was a at 09/06/20 but is taking a gap year currently. 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 Patient plays basketball for physical activity and is interested in beginning rock climbing.    Limitations Lifting;House hold activities    Diagnostic tests negative  R shoulder x-ray    Patient Stated Goals increase ROM in R shoulder    Currently in Pain? Yes    Pain Score 7     Pain Location Shoulder    Pain Orientation Right    Pain Descriptors / Indicators Sharp    Pain Type Chronic pain    Pain Onset More than a month ago    Pain Frequency Intermittent    Aggravating Factors  pt states any activity increases shoulder pain              [facility] PT Assessment - 07/07/20 0001      Assessment   Medical Diagnosis chronic R shoulder pain    Referring Provider (PT) Airline pilot, MD    Prior Therapy yes for knee/hip pain      Precautions   Precautions None      Restrictions   Weight Bearing Restrictions No      Balance Screen   Has the patient fallen in the past 6 months No      Prior Function   Level of Independence Independent      Cognition   Overall Cognitive Status Within Functional Limits for tasks assessed      ROM / Strength   AROM / PROM / Strength AROM;Strength      AROM   AROM Assessment Site Shoulder  Right/Left Shoulder Right;Left    Right Shoulder Extension 42 Degrees    Right Shoulder Flexion 160 Degrees    Right Shoulder ABduction --   WNL   Right Shoulder Internal Rotation --   Pt can IR to level of posterior iliac crest   Right Shoulder External Rotation --   mild limitation/ pain at end range   Left Shoulder Extension --   WNL   Left Shoulder Flexion --   WNL   Left Shoulder ABduction --   WNL   Left Shoulder Internal Rotation --   Pt can IR to reach inf scapula   Left Shoulder External Rotation --   WNL     Strength   Strength Assessment Site Shoulder    Right/Left Shoulder Right;Left    Right Shoulder Flexion 4/5    Right Shoulder Extension 4/5    Right Shoulder ABduction 4/5    Right Shoulder Internal Rotation 4/5    Right Shoulder External Rotation 4/5    Left Shoulder Flexion 5/5    Left Shoulder Extension 4+/5    Left Shoulder ABduction 4/5    Left Shoulder Internal Rotation 4+/5    Left  Shoulder External Rotation 4+/5      Palpation   Palpation comment TTP at R anterior deltoid and pectoral region      Special Tests    Special Tests Rotator Cuff Impingement;Laxity/Instability Tests;Biceps/Labral Tests    Rotator Cuff Impingment tests Neer impingement test;Hawkins- Kennedy test;Lift- off test;Hornblowers Sign    Laxity/Instability  Posterior Apprehension test    Biceps/Labral tests Clunk Test      Neer Impingement test    Findings Negative    Side Right      Hawkins-Kennedy test   Findings Positive    Side Right      Lift-Off test   Findings Negative    Side Right      Hornblowers Sign   Findings Positive    Side Right      Posterior Apprehension test   Findings Negative    Side Right      Clunk Test   Findings Negative    Side Right                      Objective measurements completed on examination: See above findings.       [facility] Adult PT Treatment/Exercise - 07/07/20 0001      Exercises   Exercises Shoulder      Shoulder Exercises: Supine   Flexion AROM;Both;10 reps      Shoulder Exercises: Seated   Retraction 10 reps    Retraction Limitations standing and seated      Shoulder Exercises: Sidelying   External Rotation AROM;Right;10 reps      Manual Therapy   Manual Therapy Soft tissue mobilization    Soft tissue mobilization STM for R anterior deltoid                  PT Education - 07/07/20 1521    Education Details reviewed initial HEP, PT POC, examination findings    Person(s) Educated Patient    Methods Explanation;Demonstration;Tactile cues;Verbal cues;Handout    Comprehension Verbalized understanding;Returned demonstration;Verbal cues required;Tactile cues required;Need further instruction            PT Short Term Goals - 07/07/20 1538      PT SHORT TERM GOAL #1   Title independent with initial HEP    Time 2    Period  Weeks    Status New    Target Date 07/21/20             PT Long  Term Goals - 07/07/20 1540      PT LONG TERM GOAL #1   Title Pt will be able to perform final HEP independently    Time 6    Period Weeks    Status New    Target Date 08/18/20      PT LONG TERM GOAL #2   Title Pt wil reports 0-1/10 pain with overhead motions for improved ability to perform functional activities.    Time 6    Period Weeks    Status New    Target Date 08/18/20      PT LONG TERM GOAL #3   Title Pt will be able to flex shoulder over head for one minute without pain.    Baseline Pt reports pain after holding shoulder overhead for 30 seconds    Time 6    Period Weeks    Status New    Target Date 08/18/20      PT LONG TERM GOAL #4   Title Pt will be achieve full R IR ROM for improved abiility to perform functional activities.    Baseline Pt at level of posterior iliac crest with R IR.    Time 6    Period Weeks    Status New    Target Date 08/18/20      PT LONG TERM GOAL #5   Title Pt will have 5/5 strength in R shoulder motions for improved ability to perform lifting activities at work.    Time 6    Period Weeks    Status New    Target Date 08/18/20                  Plan - 07/07/20 1523    Clinical Impression Statement Pt is a 20 y/o male with chronic R shoulder pain, muscle weakness and limited IR AROM consistent with possible labral tear. Pts pain is increased with overhead motions and prevents him from participating in his preferred physical activities. Pt has limited R IR (behind back) ROM and R sided muscle weakness due to increased pain. PT is medically necessary to increase pts R shoulder IR ROM and strengthen R shoulder musculature to improve pts ability to perform functional activities. Prognosis is fair due to duration of pts pain and pain with most activities. Plan to perform R shoulder strengthening activities to reduce pain and increase patients ability to perform functional activities.    Personal Factors and Comorbidities Time since onset of  injury/illness/exacerbation    Examination-Activity Limitations Reach Overhead;Lift;Dressing    Examination-Participation Restrictions Community Activity    Stability/Clinical Decision Making Stable/Uncomplicated    Clinical Decision Making Low    Rehab Potential Fair    PT Frequency 2x / week    PT Duration 6 weeks    PT Treatment/Interventions ADLs/Self Care Home Management;Cryotherapy;Electrical Stimulation;Iontophoresis 4mg /ml Dexamethasone;Moist Heat;Traction;Ultrasound;Functional mobility training;Therapeutic activities;Therapeutic exercise;Patient/family education;Manual techniques;Passive range of motion;Dry needling;Taping;Spinal Manipulations;Joint Manipulations;Neuromuscular re-education;Vasopneumatic Device    PT Next Visit Plan Perform UE strengthening exercises, continue manual therapy for anterior muscle tension    PT Home Exercise Plan [facility]    Consulted and Agree with Plan of Care Patient           Patient will benefit from skilled therapeutic intervention in order to improve the following deficits and impairments:  Decreased range of motion,Increased muscle spasms,Impaired UE  functional use,Pain,Decreased strength,Decreased activity tolerance  Visit Diagnosis: Chronic right shoulder pain  Muscle weakness (generalized)  Other muscle spasm     Problem List Patient Active Problem List   Diagnosis Date Noted  . Allergic rhinitis 06/21/2018  . 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 Bipolar II disorder (HCC) 07/25/2016  . ADHD   . GERD (gastroesophageal reflux disease)   . Electronic cigarette use   . Anxiety     Laureen OchsDiana Laneisha Mino SPT 07/07/2020  Sedalia MutaLauren Carroll, PT, DPT 4:05 PM  07/07/20   This entire session was performed under direct supervision and direction of a licensed therapist/therapist assistant . I have personally read, edited and approve of the note as written.   [facility] [address] [address] [place], [place], [address] Phone: [phone]   Fax:  [phone]  Name: Cory [name] MRN: [id number] Date of Birth: [birth year]

## 2020-07-15 ENCOUNTER — Encounter: Payer: Self-pay | Admitting: Physical Therapy

## 2020-07-15 ENCOUNTER — Ambulatory Visit (INDEPENDENT_AMBULATORY_CARE_PROVIDER_SITE_OTHER): Payer: No Typology Code available for payment source | Admitting: Physical Therapy

## 2020-07-15 ENCOUNTER — Other Ambulatory Visit: Payer: Self-pay

## 2020-07-15 DIAGNOSIS — M6281 Muscle weakness (generalized): Secondary | ICD-10-CM | POA: Diagnosis not present

## 2020-07-15 DIAGNOSIS — M25511 Pain in right shoulder: Secondary | ICD-10-CM

## 2020-07-15 DIAGNOSIS — M62838 Other muscle spasm: Secondary | ICD-10-CM

## 2020-07-15 DIAGNOSIS — G8929 Other chronic pain: Secondary | ICD-10-CM | POA: Diagnosis not present

## 2020-07-15 NOTE — Therapy (Signed)
[facility] [place] [facility] [address] [place], [place], [address] Phone: [phone]   Fax:  [phone]  Physical Therapy Treatment  Patient Details  Name: Cory [name] MRN: [id number] Date of Birth: 07-28-2000 Referring Provider (PT): Clementeen Graham, MD   Encounter Date: 07/15/2020   PT End of Session - 07/15/20 1326    Visit Number 2    Number of Visits 12    Date for PT Re-Evaluation 08/18/20    Authorization Type Aetna    PT Start Time 1303    PT Stop Time 1342    PT Time Calculation (min) 39 min    Activity Tolerance Patient tolerated treatment well    Behavior During Therapy [facility] for tasks assessed/performed           Past Medical History:  Diagnosis Date  . ADHD    diagnosed by Pediatrician [facility]. record in epic. Vyvanse 70mg  but also on anxiety medicine and using marijuana- likely should be prescribed by psychiatry  . Allergic rhinitis    zyrtec and singulair  . Anxiety    [facility] referred to peds psychiatyr. he reports vsiit 07/22/16 planned. buspirone  . Bipolar 1 disorder (HCC)   . Electronic cigarette use    advised against using these  . GERD (gastroesophageal reflux disease)    prilosec 20mg     History reviewed. No pertinent surgical history.  There were no vitals filed for this visit.   Subjective Assessment - 07/15/20 1324    Subjective Pt states continued pain in shoulder with activity.    Currently in Pain? Yes    Pain Score 8     Pain Location Shoulder    Pain Orientation Right    Pain Descriptors / Indicators Aching    Pain Type Chronic pain    Pain Onset More than a month ago    Pain Frequency Intermittent                             [facility] Adult PT Treatment/Exercise - 07/15/20 1311      Exercises   Exercises Shoulder      Shoulder Exercises: Supine   Protraction Limitations SA punch 5lb bar 2x10;;    Flexion AROM;Both;10 reps    Shoulder Flexion Weight (lbs) 2     Other Supine Exercises Small range at 90/90 2lb    Other Supine Exercises 90/90 ER AROM x 10;      Shoulder Exercises: Sidelying   External Rotation AROM;Right;20 reps    External Rotation Weight (lbs) 2      Shoulder Exercises: Standing   Row 20 reps    Theraband Level (Shoulder Row) Level 4 (Blue)    Retraction Limitations Low Row    Theraband Level (Shoulder Diagonals) Level 3 (Green)      Shoulder Exercises: ROM/Strengthening   UBE (Upper Arm Bike) L1 x 4 min, 2 min fwd/ 2 min bwd      Manual Therapy   Manual Therapy Soft tissue mobilization    Soft tissue mobilization STM/DTM for R pec and ant shoulder                    PT Short Term Goals - 07/07/20 1538      PT SHORT TERM GOAL #1   Title independent with initial HEP    Time 2    Period Weeks    Status New    Target Date 07/21/20  PT Long Term Goals - 07/07/20 1540      PT LONG TERM GOAL #1   Title Pt will be able to perform final HEP independently    Time 6    Period Weeks    Status New    Target Date 08/18/20      PT LONG TERM GOAL #2   Title Pt wil reports 0-1/10 pain with overhead motions for improved ability to perform functional activities.    Time 6    Period Weeks    Status New    Target Date 08/18/20      PT LONG TERM GOAL #3   Title Pt will be able to flex shoulder over head for one minute without pain.    Baseline Pt reports pain after holding shoulder overhead for 30 seconds    Time 6    Period Weeks    Status New    Target Date 08/18/20      PT LONG TERM GOAL #4   Title Pt will be achieve full R IR ROM for improved abiility to perform functional activities.    Baseline Pt at level of posterior iliac crest with R IR.    Time 6    Period Weeks    Status New    Target Date 08/18/20      PT LONG TERM GOAL #5   Title Pt will have 5/5 strength in R shoulder motions for improved ability to perform lifting activities at work.    Time 6    Period Weeks     Status New    Target Date 08/18/20                 Plan - 07/15/20 1406    Clinical Impression Statement Pt able to perform increased strengthening exercises today. Has minimal pain with strengthening today, but does have increased fatigue in 2nd set of most exercises. Plan to progress strengthening as tolerated.    Personal Factors and Comorbidities Time since onset of injury/illness/exacerbation    Examination-Activity Limitations Reach Overhead;Lift;Dressing    Examination-Participation Restrictions Community Activity    Stability/Clinical Decision Making Stable/Uncomplicated    Rehab Potential Fair    PT Frequency 2x / week    PT Duration 6 weeks    PT Treatment/Interventions ADLs/Self Care Home Management;Cryotherapy;Electrical Stimulation;Iontophoresis 4mg /ml Dexamethasone;Moist Heat;Traction;Ultrasound;Functional mobility training;Therapeutic activities;Therapeutic exercise;Patient/family education;Manual techniques;Passive range of motion;Dry needling;Taping;Spinal Manipulations;Joint Manipulations;Neuromuscular re-education;Vasopneumatic Device    PT Next Visit Plan Perform UE strengthening exercises, continue manual therapy for anterior muscle tension    PT Home Exercise Plan [facility]    Consulted and Agree with Plan of Care Patient           Patient will benefit from skilled therapeutic intervention in order to improve the following deficits and impairments:  Decreased range of motion,Increased muscle spasms,Impaired UE functional use,Pain,Decreased strength,Decreased activity tolerance  Visit Diagnosis: Chronic right shoulder pain  Muscle weakness (generalized)  Other muscle spasm     Problem List Patient Active Problem List   Diagnosis Date Noted  . Allergic rhinitis 06/21/2018  . 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 Bipolar II disorder (HCC) 07/25/2016  . ADHD   . GERD (gastroesophageal reflux disease)   . Electronic cigarette use   . Anxiety      07/27/2016, PT, DPT 2:07 PM  07/15/20    [facility] [address] [place], [place], [place] Phone: [phone]   Fax:  [phone]  Name: Cory  [name] MRN: [id number] Date of Birth: 09-21-2000

## 2020-07-17 ENCOUNTER — Other Ambulatory Visit: Payer: Self-pay

## 2020-07-17 ENCOUNTER — Ambulatory Visit (INDEPENDENT_AMBULATORY_CARE_PROVIDER_SITE_OTHER): Payer: No Typology Code available for payment source | Admitting: Physical Therapy

## 2020-07-17 ENCOUNTER — Encounter: Payer: Self-pay | Admitting: Physical Therapy

## 2020-07-17 DIAGNOSIS — M62838 Other muscle spasm: Secondary | ICD-10-CM

## 2020-07-17 DIAGNOSIS — M25511 Pain in right shoulder: Secondary | ICD-10-CM | POA: Diagnosis not present

## 2020-07-17 DIAGNOSIS — M6281 Muscle weakness (generalized): Secondary | ICD-10-CM | POA: Diagnosis not present

## 2020-07-17 DIAGNOSIS — G8929 Other chronic pain: Secondary | ICD-10-CM

## 2020-07-17 NOTE — Therapy (Signed)
[facility] [place] [facility] [address] [place], [place], [address] Phone: [phone]   Fax:  [phone]  Physical Therapy Treatment  Patient Details  Name: Cory [name] MRN: [id number] Date of Birth: 28-Jun-2000 Referring Provider (PT): Clementeen Graham, MD   Encounter Date: 07/17/2020   PT End of Session - 07/17/20 1310    Visit Number 3    Number of Visits 12    Date for PT Re-Evaluation 08/18/20    Authorization Type Aetna    PT Start Time 1308    PT Stop Time 1342    PT Time Calculation (min) 34 min    Activity Tolerance Patient tolerated treatment well    Behavior During Therapy [facility] for tasks assessed/performed           Past Medical History:  Diagnosis Date  . ADHD    diagnosed by Pediatrician [facility]. record in epic. Vyvanse 70mg  but also on anxiety medicine and using marijuana- likely should be prescribed by psychiatry  . Allergic rhinitis    zyrtec and singulair  . Anxiety    [facility] referred to peds psychiatyr. he reports vsiit 07/22/16 planned. buspirone  . Bipolar 1 disorder (HCC)   . Electronic cigarette use    advised against using these  . GERD (gastroesophageal reflux disease)    prilosec 20mg     History reviewed. No pertinent surgical history.  There were no vitals filed for this visit.   Subjective Assessment - 07/17/20 1309    Subjective Pt states minimal pain today. No increased pain after last visit.    Currently in Pain? No/denies                             [facility] Adult PT Treatment/Exercise - 07/17/20 1310      Exercises   Exercises Shoulder      Shoulder Exercises: Supine   Protraction Limitations SA punch 5lb bar 2x10;;    Flexion AROM;Both;10 reps    Shoulder Flexion Weight (lbs) 2    Other Supine Exercises AAROM, dowel x 10 full motion    Other Supine Exercises 90/90 ER AROM x 10 3lb; manually resisted IR/ER at 90 deg x 20;      Shoulder Exercises: Prone   Other  Prone Exercises high plank 45 sec x 2;      Shoulder Exercises: Sidelying   External Rotation AROM;Right;20 reps    External Rotation Weight (lbs) 2      Shoulder Exercises: Standing   External Rotation 20 reps    Theraband Level (Shoulder External Rotation) Level 3 (Green)    Internal Rotation 20 reps    Theraband Level (Shoulder Internal Rotation) Level 3 (Green)    Row 20 reps    Theraband Level (Shoulder Row) Level 4 (Blue)    Other Standing Exercises Scaption  2lb x 10, abd to 90 deg, 2lb,  x 10;      Shoulder Exercises: ROM/Strengthening   UBE (Upper Arm Bike) L1 x 4 min, 2 min fwd/ 2 min bwd      Manual Therapy   Manual Therapy Soft tissue mobilization    Soft tissue mobilization --                    PT Short Term Goals - 07/07/20 1538      PT SHORT TERM GOAL #1   Title independent with initial HEP    Time 2  Period Weeks    Status New    Target Date 07/21/20             PT Long Term Goals - 07/07/20 1540      PT LONG TERM GOAL #1   Title Pt will be able to perform final HEP independently    Time 6    Period Weeks    Status New    Target Date 08/18/20      PT LONG TERM GOAL #2   Title Pt wil reports 0-1/10 pain with overhead motions for improved ability to perform functional activities.    Time 6    Period Weeks    Status New    Target Date 08/18/20      PT LONG TERM GOAL #3   Title Pt will be able to flex shoulder over head for one minute without pain.    Baseline Pt reports pain after holding shoulder overhead for 30 seconds    Time 6    Period Weeks    Status New    Target Date 08/18/20      PT LONG TERM GOAL #4   Title Pt will be achieve full R IR ROM for improved abiility to perform functional activities.    Baseline Pt at level of posterior iliac crest with R IR.    Time 6    Period Weeks    Status New    Target Date 08/18/20      PT LONG TERM GOAL #5   Title Pt will have 5/5 strength in R shoulder motions for improved  ability to perform lifting activities at work.    Time 6    Period Weeks    Status New    Target Date 08/18/20                 Plan - 07/17/20 1359    Clinical Impression Statement Pt with improved ability for strengthening today, with fatigue, but no increased pain. He has improved ability for elevation motions, with ability for full elevation without pain. Pt to benefit from progressive strength and stabilization.    Personal Factors and Comorbidities Time since onset of injury/illness/exacerbation    Examination-Activity Limitations Reach Overhead;Lift;Dressing    Examination-Participation Restrictions Community Activity    Stability/Clinical Decision Making Stable/Uncomplicated    Rehab Potential Fair    PT Frequency 2x / week    PT Duration 6 weeks    PT Treatment/Interventions ADLs/Self Care Home Management;Cryotherapy;Electrical Stimulation;Iontophoresis 4mg /ml Dexamethasone;Moist Heat;Traction;Ultrasound;Functional mobility training;Therapeutic activities;Therapeutic exercise;Patient/family education;Manual techniques;Passive range of motion;Dry needling;Taping;Spinal Manipulations;Joint Manipulations;Neuromuscular re-education;Vasopneumatic Device    PT Next Visit Plan Perform UE strengthening exercises, continue manual therapy for anterior muscle tension    PT Home Exercise Plan [facility]    Consulted and Agree with Plan of Care Patient           Patient will benefit from skilled therapeutic intervention in order to improve the following deficits and impairments:  Decreased range of motion,Increased muscle spasms,Impaired UE functional use,Pain,Decreased strength,Decreased activity tolerance  Visit Diagnosis: Chronic right shoulder pain  Muscle weakness (generalized)  Other muscle spasm     Problem List Patient Active Problem List   Diagnosis Date Noted  . Allergic rhinitis 06/21/2018  . 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 Bipolar II disorder (HCC)  07/25/2016  . ADHD   . GERD (gastroesophageal reflux disease)   . Electronic cigarette use   . Anxiety     07/27/2016, PT, DPT 2:00  PM  07/17/20    [facility] [place] [facility] [address] [place], [place], [address] Phone: [phone]   Fax:  [phone]  Name: Cory [name] MRN: [id number] Date of Birth: 15-Dec-2000

## 2020-07-22 ENCOUNTER — Encounter: Payer: Self-pay | Admitting: Physical Therapy

## 2020-07-22 ENCOUNTER — Other Ambulatory Visit: Payer: Self-pay

## 2020-07-22 ENCOUNTER — Ambulatory Visit (INDEPENDENT_AMBULATORY_CARE_PROVIDER_SITE_OTHER): Payer: No Typology Code available for payment source | Admitting: Physical Therapy

## 2020-07-22 DIAGNOSIS — G8929 Other chronic pain: Secondary | ICD-10-CM

## 2020-07-22 DIAGNOSIS — M25511 Pain in right shoulder: Secondary | ICD-10-CM

## 2020-07-22 DIAGNOSIS — M6281 Muscle weakness (generalized): Secondary | ICD-10-CM

## 2020-07-22 DIAGNOSIS — M62838 Other muscle spasm: Secondary | ICD-10-CM

## 2020-07-22 NOTE — Patient Instructions (Signed)
Access Code: [facility] URL: [email or web address] Date: 07/22/2020 Prepared by: Sedalia Muta  Exercises Sidelying Shoulder External Rotation - 1 x daily - 2 sets - 10 reps Seated Scapular Retraction - 1 x daily - 2 sets - 10 reps Shoulder Internal Rotation with Resistance - 1 x daily - 2 sets - 10 reps Shoulder External Rotation with Anchored Resistance - 1 x daily - 2 sets - 10 reps Standing Row with Anchored Resistance - 1 x daily - 2 sets - 10 reps Tricep Push Up on Wall - 1 x daily - 2 sets - 10 reps Full Plank - 1 x daily - 3 reps - 1 hold

## 2020-07-24 ENCOUNTER — Other Ambulatory Visit: Payer: Self-pay

## 2020-07-24 ENCOUNTER — Ambulatory Visit (INDEPENDENT_AMBULATORY_CARE_PROVIDER_SITE_OTHER): Payer: No Typology Code available for payment source | Admitting: Physical Therapy

## 2020-07-24 ENCOUNTER — Encounter: Payer: Self-pay | Admitting: Physical Therapy

## 2020-07-24 DIAGNOSIS — M6281 Muscle weakness (generalized): Secondary | ICD-10-CM

## 2020-07-24 DIAGNOSIS — G8929 Other chronic pain: Secondary | ICD-10-CM

## 2020-07-24 DIAGNOSIS — M62838 Other muscle spasm: Secondary | ICD-10-CM | POA: Diagnosis not present

## 2020-07-24 DIAGNOSIS — M25511 Pain in right shoulder: Secondary | ICD-10-CM | POA: Diagnosis not present

## 2020-07-24 NOTE — Therapy (Signed)
[facility] [address] [place], [place], [address] Phone: [phone]   Fax:  [phone]  Physical Therapy Treatment  Patient Details  Name: Cory [name] MRN: [id number] Date of Birth: 2000/03/13 Referring Provider (PT): Clementeen Graham, MD   Encounter Date: 07/24/2020   PT End of Session - 07/24/20 1334    Visit Number 5    Number of Visits 12    Date for PT Re-Evaluation 08/18/20    Authorization Type Aetna    PT Start Time 1302    PT Stop Time 1332    PT Time Calculation (min) 30 min    Activity Tolerance Patient tolerated treatment well    Behavior During Therapy [facility] for tasks assessed/performed           Past Medical History:  Diagnosis Date  . ADHD    diagnosed by Pediatrician [facility]. record in epic. Vyvanse 70mg  but also on anxiety medicine and using marijuana- likely should be prescribed by psychiatry  . Allergic rhinitis    zyrtec and singulair  . Anxiety    [facility] referred to peds psychiatyr. he reports vsiit 07/22/16 planned. buspirone  . Bipolar 1 disorder (HCC)   . Electronic cigarette use    advised against using these  . GERD (gastroesophageal reflux disease)    prilosec 20mg     No past surgical history on file.  There were no vitals filed for this visit.   Subjective Assessment - 07/24/20 1333    Subjective Pt states increased soreness today, "just sore", states fatigue after last visit with planks.    Currently in Pain? Yes    Pain Score 6     Pain Location Shoulder    Pain Orientation Right    Pain Descriptors / Indicators Aching    Pain Onset More than a month ago    Pain Frequency Intermittent                             [facility] Adult PT Treatment/Exercise - 07/24/20 1300      Exercises   Exercises Shoulder      Shoulder Exercises: Supine   Protraction Limitations SA punch 5lb bil;  2x10;;    Other Supine Exercises Chest press 25 lb x 15;    Other  Supine Exercises 90/90 ER 2 x 10 3lb;      Shoulder Exercises: Sidelying   External Rotation AROM;Right;20 reps    External Rotation Weight (lbs) 2      Shoulder Exercises: Standing   External Rotation 20 reps    Theraband Level (Shoulder External Rotation) Level 3 (Green)    Internal Rotation 20 reps    Theraband Level (Shoulder Internal Rotation) Level 3 (Green)    Row 20 reps    Theraband Level (Shoulder Row) Level 4 (Blue)    Retraction Limitations Low Row x10 , no weight    Other Standing Exercises Scaption  2lb x 2x10    Other Standing Exercises Scap stabs, box, and lateral walks,  at wall RTB x 20;  wall push ups x 20      Shoulder Exercises: ROM/Strengthening   UBE (Upper Arm Bike) L1 x 5 min, 2.5 min fwd/ 2.5 min bwd      Manual Therapy   Manual Therapy Soft tissue mobilization                    PT Short Term Goals - 07/22/20  1302      PT SHORT TERM GOAL #1   Title independent with initial HEP    Time 2    Period Weeks    Status Achieved    Target Date 07/21/20             PT Long Term Goals - 07/07/20 1540      PT LONG TERM GOAL #1   Title Pt will be able to perform final HEP independently    Time 6    Period Weeks    Status New    Target Date 08/18/20      PT LONG TERM GOAL #2   Title Pt wil reports 0-1/10 pain with overhead motions for improved ability to perform functional activities.    Time 6    Period Weeks    Status New    Target Date 08/18/20      PT LONG TERM GOAL #3   Title Pt will be able to flex shoulder over head for one minute without pain.    Baseline Pt reports pain after holding shoulder overhead for 30 seconds    Time 6    Period Weeks    Status New    Target Date 08/18/20      PT LONG TERM GOAL #4   Title Pt will be achieve full R IR ROM for improved abiility to perform functional activities.    Baseline Pt at level of posterior iliac crest with R IR.    Time 6    Period Weeks    Status New    Target Date  08/18/20      PT LONG TERM GOAL #5   Title Pt will have 5/5 strength in R shoulder motions for improved ability to perform lifting activities at work.    Time 6    Period Weeks    Status New    Target Date 08/18/20                 Plan - 07/24/20 1335    Clinical Impression Statement Pt with improved scapular stability with rotation and wall push ups today. Pt able to perform ther ex, with increased fatigue with strengthening, but no increased pain. Pt to benefit from progressive stabilization and plan to practice planks and push ups again when shoulder is not as sore (pt reports 6/10 at start of session today).    Personal Factors and Comorbidities Time since onset of injury/illness/exacerbation    Examination-Activity Limitations Reach Overhead;Lift;Dressing    Examination-Participation Restrictions Community Activity    Stability/Clinical Decision Making Stable/Uncomplicated    Rehab Potential Fair    PT Frequency 2x / week    PT Duration 6 weeks    PT Treatment/Interventions ADLs/Self Care Home Management;Cryotherapy;Electrical Stimulation;Iontophoresis 4mg /ml Dexamethasone;Moist Heat;Traction;Ultrasound;Functional mobility training;Therapeutic activities;Therapeutic exercise;Patient/family education;Manual techniques;Passive range of motion;Dry needling;Taping;Spinal Manipulations;Joint Manipulations;Neuromuscular re-education;Vasopneumatic Device    PT Next Visit Plan Perform UE strengthening exercises, continue manual therapy for anterior muscle tension    PT Home Exercise Plan [facility]    Consulted and Agree with Plan of Care Patient           Patient will benefit from skilled therapeutic intervention in order to improve the following deficits and impairments:  Decreased range of motion,Increased muscle spasms,Impaired UE functional use,Pain,Decreased strength,Decreased activity tolerance  Visit Diagnosis: Chronic right shoulder pain  Muscle weakness  (generalized)  Other muscle spasm     Problem List Patient Active Problem List   Diagnosis Date Noted  .  Allergic rhinitis 06/21/2018  . 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 Bipolar II disorder (HCC) 07/25/2016  . ADHD   . GERD (gastroesophageal reflux disease)   . Electronic cigarette use   . Anxiety     Sedalia Muta, PT, DPT 1:38 PM  07/24/20    [facility] [address] [place], [place], [address] Phone: [phone]   Fax:  [phone]  Name: Cory [name] MRN: [id number] Date of Birth: 03/05/2000

## 2020-07-24 NOTE — Therapy (Signed)
[facility] [address] [place], [place], [address] Phone: [phone]   Fax:  [phone]  Physical Therapy Treatment  Patient Details  Name: Cory [name] MRN: [id number] Date of Birth: October 31, 2000 Referring Provider (PT): Clementeen Graham, MD   Encounter Date: 07/22/2020   PT End of Session - 07/24/20 1125    Visit Number 4    Number of Visits 12    Date for PT Re-Evaluation 08/18/20    Authorization Type Aetna    PT Start Time 1300    PT Stop Time 1340    PT Time Calculation (min) 40 min    Activity Tolerance Patient tolerated treatment well    Behavior During Therapy [facility] for tasks assessed/performed           Past Medical History:  Diagnosis Date  . ADHD    diagnosed by Pediatrician [facility]. record in epic. Vyvanse 70mg  but also on anxiety medicine and using marijuana- likely should be prescribed by psychiatry  . Allergic rhinitis    zyrtec and singulair  . Anxiety    [facility] referred to peds psychiatyr. he reports vsiit 07/22/16 planned. buspirone  . Bipolar 1 disorder (HCC)   . Electronic cigarette use    advised against using these  . GERD (gastroesophageal reflux disease)    prilosec 20mg     History reviewed. No pertinent surgical history.  There were no vitals filed for this visit.   Subjective Assessment - 07/24/20 1125    Subjective Pt states improving pain in shoulder. Minimal pain in the last few days. Doing better with most movments.    Currently in Pain? No/denies    Pain Score 0-No pain                             [facility] Adult PT Treatment/Exercise - 07/24/20 0001      Exercises   Exercises Shoulder      Shoulder Exercises: Supine   Protraction Limitations SA punch 5lb bar 2x10;;    Flexion AROM;Both;10 reps    Shoulder Flexion Weight (lbs) 2    Other Supine Exercises Chest press 25 lb x 15;    Other Supine Exercises 90/90 ER AROM x 10 3lb;      Shoulder  Exercises: Prone   Other Prone Exercises high plank 45 sec x 2;    Other Prone Exercises Push ups 5 x 3;      Shoulder Exercises: Sidelying   External Rotation AROM;Right;20 reps    External Rotation Weight (lbs) 2      Shoulder Exercises: Standing   External Rotation 20 reps    Theraband Level (Shoulder External Rotation) Level 3 (Green)    Internal Rotation 20 reps    Theraband Level (Shoulder Internal Rotation) Level 3 (Green)    Row 20 reps    Theraband Level (Shoulder Row) Level 4 (Blue)    Retraction Limitations Low Row x20 , no weight    Other Standing Exercises Scaption  2lb x 2x10    Other Standing Exercises Scap stabs, box at wall RTB x 20;      Shoulder Exercises: ROM/Strengthening   UBE (Upper Arm Bike) L1 x 5 min, 2.5 min fwd/ 2.5 min bwd      Manual Therapy   Manual Therapy Soft tissue mobilization                  PT Education - 07/24/20 1125  Education Details updated HEP    Person(s) Educated Patient    Methods Explanation;Demonstration;Tactile cues;Verbal cues;Handout    Comprehension Verbalized understanding;Returned demonstration;Verbal cues required;Tactile cues required;Need further instruction            PT Short Term Goals - 07/22/20 1302      PT SHORT TERM GOAL #1   Title independent with initial HEP    Time 2    Period Weeks    Status Achieved    Target Date 07/21/20             PT Long Term Goals - 07/07/20 1540      PT LONG TERM GOAL #1   Title Pt will be able to perform final HEP independently    Time 6    Period Weeks    Status New    Target Date 08/18/20      PT LONG TERM GOAL #2   Title Pt wil reports 0-1/10 pain with overhead motions for improved ability to perform functional activities.    Time 6    Period Weeks    Status New    Target Date 08/18/20      PT LONG TERM GOAL #3   Title Pt will be able to flex shoulder over head for one minute without pain.    Baseline Pt reports pain after holding shoulder  overhead for 30 seconds    Time 6    Period Weeks    Status New    Target Date 08/18/20      PT LONG TERM GOAL #4   Title Pt will be achieve full R IR ROM for improved abiility to perform functional activities.    Baseline Pt at level of posterior iliac crest with R IR.    Time 6    Period Weeks    Status New    Target Date 08/18/20      PT LONG TERM GOAL #5   Title Pt will have 5/5 strength in R shoulder motions for improved ability to perform lifting activities at work.    Time 6    Period Weeks    Status New    Target Date 08/18/20                 Plan - 07/24/20 1125    Clinical Impression Statement Pt with improving ability for strengthening, and ability to progress exercises. He has minimal/no pain with strengthening today. He has noted weakness and difficulty with R scapular muscle strength, seen with rows, and closed chained stabilization today. Pt to benefit from continued work on strength and stability.    Personal Factors and Comorbidities Time since onset of injury/illness/exacerbation    Examination-Activity Limitations Reach Overhead;Lift;Dressing    Examination-Participation Restrictions Community Activity    Stability/Clinical Decision Making Stable/Uncomplicated    Rehab Potential Fair    PT Frequency 2x / week    PT Duration 6 weeks    PT Treatment/Interventions ADLs/Self Care Home Management;Cryotherapy;Electrical Stimulation;Iontophoresis 4mg /ml Dexamethasone;Moist Heat;Traction;Ultrasound;Functional mobility training;Therapeutic activities;Therapeutic exercise;Patient/family education;Manual techniques;Passive range of motion;Dry needling;Taping;Spinal Manipulations;Joint Manipulations;Neuromuscular re-education;Vasopneumatic Device    PT Next Visit Plan Perform UE strengthening exercises, continue manual therapy for anterior muscle tension    PT Home Exercise Plan [facility]    Consulted and Agree with Plan of Care Patient           Patient will  benefit from skilled therapeutic intervention in order to improve the following deficits and impairments:  Decreased range of motion,Increased muscle spasms,Impaired  UE functional use,Pain,Decreased strength,Decreased activity tolerance  Visit Diagnosis: Chronic right shoulder pain  Muscle weakness (generalized)  Other muscle spasm     Problem List Patient Active Problem List   Diagnosis Date Noted  . Allergic rhinitis 06/21/2018  . 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 Bipolar II disorder (HCC) 07/25/2016  . ADHD   . GERD (gastroesophageal reflux disease)   . Electronic cigarette use   . Anxiety     Sedalia Muta, PT, DPT 11:29 AM  07/24/20    [facility] [address] [place], [place], [address] Phone: [phone]   Fax:  [phone]  Name: Cory [name] MRN: [id number] Date of Birth: 2001-01-03

## 2020-08-18 ENCOUNTER — Ambulatory Visit (INDEPENDENT_AMBULATORY_CARE_PROVIDER_SITE_OTHER): Payer: No Typology Code available for payment source | Admitting: Physical Therapy

## 2020-08-18 ENCOUNTER — Other Ambulatory Visit: Payer: Self-pay

## 2020-08-18 ENCOUNTER — Encounter: Payer: Self-pay | Admitting: Physical Therapy

## 2020-08-18 DIAGNOSIS — M25511 Pain in right shoulder: Secondary | ICD-10-CM | POA: Diagnosis not present

## 2020-08-18 DIAGNOSIS — M62838 Other muscle spasm: Secondary | ICD-10-CM | POA: Diagnosis not present

## 2020-08-18 DIAGNOSIS — M6281 Muscle weakness (generalized): Secondary | ICD-10-CM

## 2020-08-18 DIAGNOSIS — G8929 Other chronic pain: Secondary | ICD-10-CM | POA: Diagnosis not present

## 2020-08-18 NOTE — Patient Instructions (Signed)
Access Code: [facility] URL: [email or web address] Date: 08/18/2020 Prepared by: Sedalia Muta  Exercises Shoulder Internal Rotation with Resistance - 1 x daily - 2 sets - 10 reps Shoulder External Rotation with Anchored Resistance - 1 x daily - 2 sets - 10 reps Standing Single Arm Shoulder External Rotation in Abduction with Anchored Resistance - 1 x daily - 1-2 sets - 10 reps Supine Shoulder External Rotation with Dumbbell - 1 x daily - 1 sets - 10 reps Standing Row with Anchored Resistance - 1 x daily - 2 sets - 10 reps Tricep Push Up on Wall - 1 x daily - 2 sets - 10 reps Full Plank - 1 x daily - 3 reps - 1 hold

## 2020-08-18 NOTE — Therapy (Signed)
[facility] [address] [place], [place], [address] Phone: [phone]   Fax:  [phone]  Physical Therapy Treatment/Discharge   Patient Details  Name: Cory [name] MRN: [id number] Date of Birth: 02-Aug-2000 Referring Provider (PT): Lynne Leader, MD   Encounter Date: 08/18/2020   PT End of Session - 08/18/20 1223     Visit Number 6    Number of Visits 12    Date for PT Re-Evaluation 08/18/20    Authorization Type Aetna    PT Start Time 1220    PT Stop Time 1258    PT Time Calculation (min) 38 min    Activity Tolerance Patient tolerated treatment well    Behavior During Therapy [facility] for tasks assessed/performed             Past Medical History:  Diagnosis Date   ADHD    diagnosed by Pediatrician [facility]. record in epic. Vyvanse 39m but also on anxiety medicine and using marijuana- likely should be prescribed by psychiatry   Allergic rhinitis    zyrtec and singulair   Anxiety    [facility] referred to peds psychiatyr. he reports vsiit 07/22/16 planned. buspirone   Bipolar 1 disorder (HCC)    Electronic cigarette use    advised against using these   GERD (gastroesophageal reflux disease)    prilosec 24m   History reviewed. No pertinent surgical history.  There were no vitals filed for this visit.   Subjective Assessment - 08/18/20 1214     Subjective Pt last seen 07/24/20. States minimal pain, and able to play basketball and do HEP.    Currently in Pain? No/denies    Pain Score 0-No pain                               [facility] Adult PT Treatment/Exercise - 08/18/20 0001       Exercises   Exercises Shoulder      Shoulder Exercises: Supine   Protraction Limitations SA punch 5lb bil;  2x10;;    Other Supine Exercises Chest press 25 lb x 15;    Other Supine Exercises 90/90 ER  x 10      Shoulder Exercises: Prone   Other Prone Exercises high plank 45 sec x 2;    Other Prone  Exercises Push ups 5 x 3;      Shoulder Exercises: Sidelying   External Rotation --    External Rotation Weight (lbs) --      Shoulder Exercises: Standing   External Rotation 20 reps    Theraband Level (Shoulder External Rotation) Level 3 (Green)    Internal Rotation 20 reps    Theraband Level (Shoulder Internal Rotation) Level 3 (Green)    Row 20 reps    Theraband Level (Shoulder Row) Level 4 (Blue)    Retraction Limitations --    Other Standing Exercises Scaption  2lb x 2x10    Other Standing Exercises --      Shoulder Exercises: ROM/Strengthening   UBE (Upper Arm Bike) L1 x 5 min, 2.5 min fwd/ 2.5 min bwd      Manual Therapy   Manual Therapy Soft tissue mobilization                    PT Education - 08/18/20 1223     Education Details Final HEP reviewed today .    Person(s) Educated Patient    Methods Explanation;Demonstration;Verbal  cues;Handout    Comprehension Verbalized understanding;Returned demonstration;Verbal cues required              PT Short Term Goals - 07/22/20 1302       PT SHORT TERM GOAL #1   Title independent with initial HEP    Time 2    Period Weeks    Status Achieved    Target Date 07/21/20               PT Long Term Goals - 08/18/20 1224       PT LONG TERM GOAL #1   Title Pt will be able to perform final HEP independently    Time 6    Period Weeks    Status Achieved      PT LONG TERM GOAL #2   Title Pt wil reports 0-1/10 pain with overhead motions for improved ability to perform functional activities.    Time 6    Period Weeks    Status Achieved      PT LONG TERM GOAL #3   Title Pt will be able to flex shoulder over head for one minute without pain.    Baseline Pt reports pain after holding shoulder overhead for 30 seconds    Time 6    Period Weeks    Status Achieved      PT LONG TERM GOAL #4   Title Pt will be achieve full R IR ROM for improved abiility to perform functional activities.    Time 6     Period Weeks    Status Achieved      PT LONG TERM GOAL #5   Title Pt will have 5/5 strength in R shoulder motions for improved ability to perform lifting activities at work.    Time 6    Period Weeks    Status Achieved                   Plan - 08/18/20 1304     Clinical Impression Statement Pt doing well at this time. Has minimal pain reports and has been able to progress back to basketball. Has been diigent with HEP. Pt with full ROM and strength but will benefit from continued strengthening and scapular stabilization. Reviewed final HEP today. Pt has met goals and is ready for d/c to HEP, pt in agreement with plan.    Personal Factors and Comorbidities Time since onset of injury/illness/exacerbation    Examination-Activity Limitations Reach Overhead;Lift;Dressing    Examination-Participation Restrictions Community Activity    Stability/Clinical Decision Making Stable/Uncomplicated    Rehab Potential Fair    PT Frequency 2x / week    PT Duration 6 weeks    PT Treatment/Interventions ADLs/Self Care Home Management;Cryotherapy;Electrical Stimulation;Iontophoresis 15m/ml Dexamethasone;Moist Heat;Traction;Ultrasound;Functional mobility training;Therapeutic activities;Therapeutic exercise;Patient/family education;Manual techniques;Passive range of motion;Dry needling;Taping;Spinal Manipulations;Joint Manipulations;Neuromuscular re-education;Vasopneumatic Device    PT Next Visit Plan Perform UE strengthening exercises, continue manual therapy for anterior muscle tension    PT Home Exercise Plan [facility]   Consulted and Agree with Plan of Care Patient             Patient will benefit from skilled therapeutic intervention in order to improve the following deficits and impairments:  Decreased range of motion, Increased muscle spasms, Impaired UE functional use, Pain, Decreased strength, Decreased activity tolerance  Visit Diagnosis: Chronic right shoulder pain  Other muscle  spasm  Muscle weakness (generalized)     Problem List Patient Active Problem List   Diagnosis Date Noted  Allergic rhinitis 06/21/2018   Instability of right knee joint 08/06/2016   Bipolar II disorder ([place]) 07/25/2016   ADHD    GERD (gastroesophageal reflux disease)    Electronic cigarette use    Anxiety    Lyndee Hensen, PT, DPT 1:07 PM  08/18/20    [facility] [place], [place], [address] Phone: [phone]   Fax:  [phone]  Name: Cory [name] MRN: [id number] Date of Birth: October 03, 2000  PHYSICAL THERAPY DISCHARGE SUMMARY  Visits from Start of Care: 6   Plan: Patient agrees to discharge.  Patient goals were met. Patient is being discharged due to meeting the stated rehab goals.      Lyndee Hensen, PT, DPT 1:07 PM  08/18/20

## 2020-08-19 ENCOUNTER — Ambulatory Visit (INDEPENDENT_AMBULATORY_CARE_PROVIDER_SITE_OTHER): Payer: Self-pay | Admitting: Family Medicine

## 2020-08-19 ENCOUNTER — Encounter: Payer: No Typology Code available for payment source | Admitting: Physical Therapy

## 2020-08-21 ENCOUNTER — Encounter (INDEPENDENT_AMBULATORY_CARE_PROVIDER_SITE_OTHER): Payer: Self-pay

## 2020-08-21 ENCOUNTER — Encounter (INDEPENDENT_AMBULATORY_CARE_PROVIDER_SITE_OTHER): Payer: Self-pay | Admitting: Family Medicine

## 2020-08-21 ENCOUNTER — Other Ambulatory Visit: Payer: Self-pay

## 2020-08-21 ENCOUNTER — Ambulatory Visit (INDEPENDENT_AMBULATORY_CARE_PROVIDER_SITE_OTHER): Payer: No Typology Code available for payment source | Admitting: Family Medicine

## 2020-08-21 VITALS — BP 114/75 | HR 63 | Temp 98.5°F | Ht 73.0 in | Wt 232.0 lb

## 2020-08-21 DIAGNOSIS — F419 Anxiety disorder, unspecified: Secondary | ICD-10-CM

## 2020-08-21 DIAGNOSIS — K219 Gastro-esophageal reflux disease without esophagitis: Secondary | ICD-10-CM

## 2020-08-21 DIAGNOSIS — Z1331 Encounter for screening for depression: Secondary | ICD-10-CM | POA: Diagnosis not present

## 2020-08-21 DIAGNOSIS — Z0289 Encounter for other administrative examinations: Secondary | ICD-10-CM

## 2020-08-21 DIAGNOSIS — Z6830 Body mass index (BMI) 30.0-30.9, adult: Secondary | ICD-10-CM

## 2020-08-21 DIAGNOSIS — Z72820 Sleep deprivation: Secondary | ICD-10-CM | POA: Diagnosis not present

## 2020-08-21 DIAGNOSIS — Z9189 Other specified personal risk factors, not elsewhere classified: Secondary | ICD-10-CM | POA: Diagnosis not present

## 2020-08-21 DIAGNOSIS — F4522 Body dysmorphic disorder: Secondary | ICD-10-CM

## 2020-08-21 DIAGNOSIS — F319 Bipolar disorder, unspecified: Secondary | ICD-10-CM

## 2020-08-21 DIAGNOSIS — R5383 Other fatigue: Secondary | ICD-10-CM | POA: Diagnosis not present

## 2020-08-21 DIAGNOSIS — R0602 Shortness of breath: Secondary | ICD-10-CM

## 2020-08-21 DIAGNOSIS — E669 Obesity, unspecified: Secondary | ICD-10-CM

## 2020-08-21 DIAGNOSIS — M2550 Pain in unspecified joint: Secondary | ICD-10-CM

## 2020-08-21 NOTE — Progress Notes (Signed)
Chief Complaint:   OBESITY Cory [name] (MR# [id number]) is a 20 y.o. male who presents for evaluation and treatment of obesity and related comorbidities. Current BMI is Body mass index is 30.61 kg/m. Cory [name] has been struggling with his weight for many years and has been unsuccessful in either losing weight, maintaining weight loss, or reaching his healthy weight goal.  Cory [name] is currently in the action stage of change and ready to dedicate time achieving and maintaining a healthier weight. Cory [name] is interested in becoming our patient and working on intensive lifestyle modifications including (but not limited to) diet and exercise for weight loss.  Cory [name]'s habits were reviewed today and are as follows: he thinks his family will eat healthier with him, he struggles with family and or coworkers weight loss sabotage, his desired weight loss is 40 pounds, he has been heavy most of his life, he started gaining excessive weight in January, his heaviest weight ever was 240 pounds, he is a picky eater and doesn't like to eat healthier foods, he craves cake, juice, ice cream, pizza, cheese steaks, fries, chips, and Reece's cups, he snacks frequently in the evenings, he wakes up frequently in the middle of the night to eat, he frequently makes poor food choices, he has problems with excessive hunger, he frequently eats larger portions than normal, he has binge eating behaviors, and he struggles with emotional eating.  Depression Screen Cory [name]'s Food and Mood (modified PHQ-9) score was 19.  Depression screen [name] 2/9 08/21/2020  Decreased Interest 3  Down, Depressed, Hopeless 3  PHQ - 2 Score 6  Altered sleeping 1  Tired, decreased energy 3  Change in appetite 3  Feeling bad or failure about yourself  3  Trouble concentrating 0  Moving slowly or fidgety/restless 3  Suicidal thoughts 0  PHQ-9 Score 19  Difficult doing work/chores Somewhat difficult   Assessment/Plan:   1. Other fatigue Kenshawn denies  daytime somnolence and reports waking up still tired. Patent has a history of symptoms of morning fatigue and snoring. Cory [name] generally gets 3 or 4 hours of sleep per night, and states that he has poor quality sleep. Snoring is present. Apneic episodes are present. Epworth Sleepiness Score is 1.  - EKG 12-Lead - Anemia panel - CBC with Differential/Platelet - Comprehensive metabolic panel - Insulin, random - Hemoglobin A1c - TSH - T4, free  2. SOB (shortness of breath) on exertion Bravery notes increasing shortness of breath with exercising and seems to be worsening over time with weight gain. He notes getting out of breath sooner with activity than he used to. This has gotten worse recently. Cory [name] denies shortness of breath at rest or orthopnea.  Bartolo does feel that he gets out of breath more easily that he used to when he exercises. Cory [name]'s shortness of breath appears to be obesity related and exercise induced. He has agreed to work on weight loss and gradually increase exercise to treat his exercise induced shortness of breath. Will continue to monitor closely.  3. Gastroesophageal reflux disease, unspecified whether esophagitis present We reviewed the diagnosis of GERD and importance of treatment. We discussed "red flag" symptoms and the importance of follow up if symptoms persisted despite treatment. We reviewed non-pharmacologic management of GERD symptoms: including: caffeine reduction, dietary changes, elevate HOB, NPO after supper, reduction of alcohol intake, tobacco cessation, and weight loss.  4. Poor sleep Cory [name] has difficulty sleeping.  Gets 3-4 hours of sleep at night.  Has tried trazodone in the  past, but it did not work.  We will continue to monitor symptoms as they relate to his weight loss journey.  5. Multiple joint pain Cory [name] has pain in his hips and knees.  Plan:  Will follow because mobility and pain control are important for weight management.  6. Body dysmorphic disorder Hx  of restriction, previously hospitalized for it. In much better mental space these days. We discussed healthy food practices and mindset.  7. Bipolar affective disorder, remission status unspecified (HCC) Cory [name] is taking Lamictal 150 mg twice daily.  He is followed by Psychiatry.    - Anemia panel - CBC with Differential/Platelet - Comprehensive metabolic panel - Insulin, random - Hemoglobin A1c - Lipid panel - TSH - T4, free  8. Anxiety, with emotional eating Not at goal. Medication: None. Plan: Behavior modification techniques were discussed today to help deal with emotional/non-hunger eating behaviors.  9. Depression screening Cory [name] was screened for depression as part of his new patient workup.  PHQ is 19.  Cory [name] had a positive depression screening. Depression is commonly associated with obesity and often results in emotional eating behaviors. We will monitor this closely and work on CBT to help improve the non-hunger eating patterns. He is already working with Psychiatry.  10. At risk for deficient intake of food Cory [name] was given extensive education and counseling today of more than 9 minutes on risks associated with deficient food intake.  Counseled him on the importance of following our prescribed meal plan and eating adequate amounts of protein.  Discussed with Cory [name] that inadequate food intake over longer periods of time can slow their metabolism down significantly.    11. Class 1 obesity with serious comorbidity and body mass index (BMI) of 30.0 to 30.9 in adult, unspecified obesity type  Cory [name] is currently in the action stage of change and his goal is to continue with weight loss efforts. I recommend Cory [name] begin the structured treatment plan as follows:  He has agreed to the Category 3 Plan.  Exercise goals: No exercise has been prescribed at this time.   Behavioral modification strategies: increasing lean protein intake, decreasing simple carbohydrates, increasing  vegetables, and increasing water intake.  He was informed of the importance of frequent follow-up visits to maximize his success with intensive lifestyle modifications for his multiple health conditions. He was informed we would discuss his lab results at his next visit unless there is a critical issue that needs to be addressed sooner. Ilay agreed to keep his next visit at the agreed upon time to discuss these results.  Objective:   Blood pressure 114/75, pulse 63, temperature 98.5 F (36.9 C), temperature source Oral, height 6\' 1"  (1.854 m), weight 232 lb (105.2 kg), SpO2 100 %. Body mass index is 30.61 kg/m.  EKG: Normal sinus rhythm, rate 74 bpm.  Indirect Calorimeter completed today shows a VO2 of 209 and a REE of 1958.  His calculated basal metabolic rate is thus his basal metabolic rate is worse than expected.  General: Cooperative, alert, well developed, in no acute distress. HEENT: Conjunctivae and lids unremarkable. Cardiovascular: Regular rhythm.  Lungs: Normal work of breathing. Neurologic: No focal deficits.   Lab Results  Component Value Date   CREATININE 1.02 12/31/2019   BUN 16 12/31/2019   NA 144 12/31/2019   K 4.0 12/31/2019   CL 107 12/31/2019   CO2 23 12/31/2019   Lab Results  Component Value Date   ALT 66 (H) 12/31/2019   AST 29 12/31/2019  ALKPHOS 88 12/31/2019   BILITOT 0.6 12/31/2019   Lab Results  Component Value Date   HGBA1C 5.2 07/27/2016   Lab Results  Component Value Date   TSH 1.905 07/27/2016   Lab Results  Component Value Date   CHOL 138 07/27/2016   HDL 34 (L) 07/27/2016   LDLCALC 89 07/27/2016   TRIG 77 07/27/2016   CHOLHDL 4.1 07/27/2016   Lab Results  Component Value Date   WBC 5.4 12/31/2019   HGB 16.0 12/31/2019   HCT 48.1 12/31/2019   MCV 87.0 12/31/2019   PLT 273 12/31/2019   Attestation Statements:   This is the patient's first visit at [facility]. The patient's NEW PATIENT PACKET was  reviewed at length. Included in the packet: current and past health history, medications, allergies, ROS, gynecologic history (women only), surgical history, family history, social history, weight history, weight loss surgery history (for those that have had weight loss surgery), nutritional evaluation, mood and food questionnaire, PHQ9, Epworth questionnaire, sleep habits questionnaire, patient life and health improvement goals questionnaire. These will all be scanned into the patient's chart under media.   During the visit, I independently reviewed the patient's EKG, bioimpedance scale results, and indirect calorimeter results. I used this information to tailor a meal plan for the patient that will help him to lose weight and will improve his obesity-related conditions going forward. I performed a medically necessary appropriate examination and/or evaluation. I discussed the assessment and treatment plan with the patient. The patient was provided an opportunity to ask questions and all were answered. The patient agreed with the plan and demonstrated an understanding of the instructions. Labs were ordered at this visit and will be reviewed at the next visit unless more critical results need to be addressed immediately. Clinical information was updated and documented in the EMR.   I, Insurance claims handler, CMA, am acting as transcriptionist for Helane Rima, DO  I have reviewed the above documentation for accuracy and completeness, and I agree with the above. Helane Rima, DO

## 2020-08-22 LAB — LIPID PANEL
Chol/HDL Ratio: 3.6 ratio (ref 0.0–5.0)
Cholesterol, Total: 150 mg/dL (ref 100–199)
HDL: 42 mg/dL (ref >39)
LDL Chol Calc (NIH): 90 mg/dL (ref 0–99)
Triglycerides: 94 mg/dL (ref 0–149)
VLDL Cholesterol Cal: 18 mg/dL (ref 5–40)

## 2020-08-22 LAB — CBC WITH DIFFERENTIAL/PLATELET
Basophils Absolute: 0 10*3/uL (ref 0.0–0.2)
Basos: 1 %
EOS (ABSOLUTE): 0.2 10*3/uL (ref 0.0–0.4)
Eos: 5 %
Hemoglobin: 17.1 g/dL (ref 13.0–17.7)
Immature Grans (Abs): 0 10*3/uL (ref 0.0–0.1)
Immature Granulocytes: 1 %
Lymphocytes Absolute: 2 10*3/uL (ref 0.7–3.1)
Lymphs: 46 %
MCH: 28.9 pg (ref 26.6–33.0)
MCHC: 33.6 g/dL (ref 31.5–35.7)
MCV: 86 fL (ref 79–97)
Monocytes Absolute: 0.3 10*3/uL (ref 0.1–0.9)
Monocytes: 7 %
Neutrophils Absolute: 1.6 10*3/uL (ref 1.4–7.0)
Neutrophils: 40 %
Platelets: 284 10*3/uL (ref 150–450)
RBC: 5.91 x10E6/uL — ABNORMAL HIGH (ref 4.14–5.80)
RDW: 13.3 % (ref 11.6–15.4)
WBC: 4.1 10*3/uL (ref 3.4–10.8)

## 2020-08-22 LAB — COMPREHENSIVE METABOLIC PANEL
ALT: 55 IU/L — ABNORMAL HIGH (ref 0–44)
AST: 51 IU/L — ABNORMAL HIGH (ref 0–40)
Albumin/Globulin Ratio: 1.7 (ref 1.2–2.2)
Albumin: 4.6 g/dL (ref 4.1–5.2)
Alkaline Phosphatase: 113 IU/L (ref 44–121)
BUN/Creatinine Ratio: 12 (ref 9–20)
BUN: 13 mg/dL (ref 6–20)
Bilirubin Total: 0.3 mg/dL (ref 0.0–1.2)
CO2: 19 mmol/L — ABNORMAL LOW (ref 20–29)
Calcium: 9.3 mg/dL (ref 8.7–10.2)
Chloride: 104 mmol/L (ref 96–106)
Creatinine, Ser: 1.07 mg/dL (ref 0.76–1.27)
Globulin, Total: 2.7 g/dL (ref 1.5–4.5)
Glucose: 81 mg/dL (ref 65–99)
Potassium: 4.4 mmol/L (ref 3.5–5.2)
Sodium: 141 mmol/L (ref 134–144)
Total Protein: 7.3 g/dL (ref 6.0–8.5)
eGFR: 101 mL/min/{1.73_m2} (ref >59)

## 2020-08-22 LAB — ANEMIA PANEL
Ferritin: 95 ng/mL (ref 30–400)
Folate, Hemolysate: 421 ng/mL
Folate, RBC: 827 ng/mL (ref >498)
Hematocrit: 50.9 % (ref 37.5–51.0)
Iron Saturation: 22 % (ref 15–55)
Iron: 61 ug/dL (ref 38–169)
Retic Ct Pct: 1.7 % (ref 0.6–2.6)
Total Iron Binding Capacity: 272 ug/dL (ref 250–450)
UIBC: 211 ug/dL (ref 111–343)
Vitamin B-12: 584 pg/mL (ref 232–1245)

## 2020-08-22 LAB — VITAMIN D 25 HYDROXY (VIT D DEFICIENCY, FRACTURES): Vit D, 25-Hydroxy: 18.2 ng/mL — ABNORMAL LOW (ref 30.0–100.0)

## 2020-08-22 LAB — HEMOGLOBIN A1C
Est. average glucose Bld gHb Est-mCnc: 105 mg/dL
Hgb A1c MFr Bld: 5.3 % (ref 4.8–5.6)

## 2020-08-22 LAB — TSH: TSH: 1.74 u[IU]/mL (ref 0.450–4.500)

## 2020-08-22 LAB — INSULIN, RANDOM: INSULIN: 13.5 u[IU]/mL (ref 2.6–24.9)

## 2020-08-22 LAB — T4, FREE: Free T4: 1.47 ng/dL (ref 0.82–1.77)

## 2020-09-04 ENCOUNTER — Ambulatory Visit (INDEPENDENT_AMBULATORY_CARE_PROVIDER_SITE_OTHER): Payer: No Typology Code available for payment source | Admitting: Family Medicine

## 2020-09-04 ENCOUNTER — Other Ambulatory Visit: Payer: Self-pay

## 2020-09-04 ENCOUNTER — Encounter (INDEPENDENT_AMBULATORY_CARE_PROVIDER_SITE_OTHER): Payer: Self-pay | Admitting: Family Medicine

## 2020-09-04 VITALS — BP 112/72 | HR 63 | Temp 98.3°F | Ht 73.0 in | Wt 221.0 lb

## 2020-09-04 DIAGNOSIS — R7401 Elevation of levels of liver transaminase levels: Secondary | ICD-10-CM

## 2020-09-04 DIAGNOSIS — E8881 Metabolic syndrome: Secondary | ICD-10-CM

## 2020-09-04 DIAGNOSIS — E559 Vitamin D deficiency, unspecified: Secondary | ICD-10-CM

## 2020-09-04 DIAGNOSIS — E669 Obesity, unspecified: Secondary | ICD-10-CM

## 2020-09-04 DIAGNOSIS — F319 Bipolar disorder, unspecified: Secondary | ICD-10-CM | POA: Diagnosis not present

## 2020-09-04 DIAGNOSIS — Z9189 Other specified personal risk factors, not elsewhere classified: Secondary | ICD-10-CM

## 2020-09-04 DIAGNOSIS — Z6830 Body mass index (BMI) 30.0-30.9, adult: Secondary | ICD-10-CM

## 2020-09-04 MED ORDER — VITAMIN D (ERGOCALCIFEROL) 1.25 MG (50000 UNIT) PO CAPS: 50000.0000 [IU] | ORAL_CAPSULE | ORAL | 0 refills | Status: DC

## 2020-09-17 NOTE — Progress Notes (Signed)
Chief Complaint:   OBESITY Cory [name] is here to discuss his progress with his obesity treatment plan along with follow-up of his obesity related diagnoses.   Today's visit was #: 2 Starting weight: 232 lbs Starting date: 08/21/2020 Today's weight: 221 lbs Today's date: 09/04/2020 Weight change since last visit: 11 lbs Total lbs lost to date: 11 lbs Body mass index is 29.16 kg/m.  Total weight loss percentage to date: -4.74%  Interim History: Cory [name] has not had any juice/slushies.  He has been drinking more water.  He has not had any ETOH.  He endorses polyphagia. Current Meal Plan: the Category 3 Plan for 90% of the time.  Current Exercise Plan: Basketball for 30-60 minutes 3-4 times per week.  Assessment/Plan:   Meds ordered this encounter  Medications   Vitamin D, Ergocalciferol, (DRISDOL) 1.25 MG (50000 UNIT) CAPS capsule    Sig: Take 1 capsule (50,000 Units total) by mouth every 7 (seven) days.    Dispense:  12 capsule    Refill:  0   1. Transaminitis Elevated liver transaminases with an ALT predominance combined with obesity and insulin resistance is characteristic, but not diagnostic of non-alcoholic fatty liver disease (NAFLD). NAFLD is the 2nd leading cause of liver transplant in adults. Treatment includes weight loss, elimination of sweet drinks, including juice, avoidance of high fructose corn syrup, and exercise. As always, avoiding alcohol consumption is important.  Lab Results  Component Value Date   ALT 55 (H) 08/21/2020   AST 51 (H) 08/21/2020   ALKPHOS 113 08/21/2020   BILITOT 0.3 08/21/2020   2. Insulin resistance Not at goal. Goal is HgbA1c < 5.7, fasting insulin closer to 5.  Medication: None.    Plan:  He will continue to focus on protein-rich, low simple carbohydrate foods. We reviewed the importance of hydration, regular exercise for stress reduction, and restorative sleep.   Lab Results  Component Value Date   HGBA1C 5.3 08/21/2020   Lab Results   Component Value Date   INSULIN 13.5 08/21/2020   3. Vitamin D deficiency Not at goal.  Plan: Start to take prescription Vitamin D @50 ,000 IU every week as prescribed.  Follow-up for routine testing of Vitamin D, at least 2-3 times per year to avoid over-replacement.  Lab Results  Component Value Date   VD25OH 18.2 (L) 08/21/2020   - Start Vitamin D, Ergocalciferol, (DRISDOL) 1.25 MG (50000 UNIT) CAPS capsule; Take 1 capsule (50,000 Units total) by mouth every 7 (seven) days.  Dispense: 12 capsule; Refill: 0  4. Bipolar affective disorder, remission status unspecified (HCC) Followed by Psychiatry.  Taking Lamictal 150 mg twice daily.  He is seen at [facility] by 08/23/2020, NP.  5. At risk for deficient intake of food Cory [name] was given extensive education and counseling today of more than 8 minutes on risks associated with deficient food intake.  Counseled him on the importance of eating adequate amounts of protein.    6. Obesity, current BMI 29.2  Course: Cory [name] is currently in the action stage of change. As such, his goal is to continue with weight loss efforts.   Nutrition goals: He has agreed to the Category 3 Plan.   Exercise goals:  As is.  Behavioral modification strategies: increasing lean protein intake, decreasing simple carbohydrates, increasing vegetables, increasing water intake, and emotional eating strategies.  Cory [name] has agreed to follow-up with our clinic in 4 weeks. He was informed of the importance of frequent follow-up visits to  maximize his success with intensive lifestyle modifications for his multiple health conditions.   Objective:   Blood pressure 112/72, pulse 63, temperature 98.3 F (36.8 C), temperature source Oral, height 6\' 1"  (1.854 m), weight 221 lb (100.2 kg), SpO2 99 %. Body mass index is 29.16 kg/m.  General: Cooperative, alert, well developed, in no acute distress. HEENT: Conjunctivae and lids unremarkable. Cardiovascular: Regular  rhythm.  Lungs: Normal work of breathing. Neurologic: No focal deficits.   Lab Results  Component Value Date   CREATININE 1.07 08/21/2020   BUN 13 08/21/2020   NA 141 08/21/2020   K 4.4 08/21/2020   CL 104 08/21/2020   CO2 19 (L) 08/21/2020   Lab Results  Component Value Date   ALT 55 (H) 08/21/2020   AST 51 (H) 08/21/2020   ALKPHOS 113 08/21/2020   BILITOT 0.3 08/21/2020   Lab Results  Component Value Date   HGBA1C 5.3 08/21/2020   HGBA1C 5.2 07/27/2016   Lab Results  Component Value Date   INSULIN 13.5 08/21/2020   Lab Results  Component Value Date   TSH 1.740 08/21/2020   Lab Results  Component Value Date   CHOL 150 08/21/2020   HDL 42 08/21/2020   LDLCALC 90 08/21/2020   TRIG 94 08/21/2020   CHOLHDL 3.6 08/21/2020   Lab Results  Component Value Date   VD25OH 18.2 (L) 08/21/2020   Lab Results  Component Value Date   WBC 4.1 08/21/2020   HGB 17.1 08/21/2020   HCT 50.9 08/21/2020   MCV 86 08/21/2020   PLT 284 08/21/2020   Lab Results  Component Value Date   IRON 61 08/21/2020   TIBC 272 08/21/2020   FERRITIN 95 08/21/2020   Attestation Statements:   Reviewed by clinician on day of visit: allergies, medications, problem list, medical history, surgical history, family history, social history, and previous encounter notes.  I, 08/23/2020, CMA, am acting as transcriptionist for Insurance claims handler, DO  I have reviewed the above documentation for accuracy and completeness, and I agree with the above. Helane Rima, DO

## 2020-09-22 ENCOUNTER — Encounter: Payer: Self-pay | Admitting: Family Medicine

## 2020-09-22 ENCOUNTER — Other Ambulatory Visit: Payer: Self-pay

## 2020-09-22 ENCOUNTER — Ambulatory Visit (INDEPENDENT_AMBULATORY_CARE_PROVIDER_SITE_OTHER): Payer: No Typology Code available for payment source | Admitting: Family Medicine

## 2020-09-22 VITALS — BP 114/80 | HR 68 | Temp 98.1°F | Resp 15 | Ht 73.0 in | Wt 217.0 lb

## 2020-09-22 DIAGNOSIS — K219 Gastro-esophageal reflux disease without esophagitis: Secondary | ICD-10-CM | POA: Diagnosis not present

## 2020-09-22 DIAGNOSIS — E559 Vitamin D deficiency, unspecified: Secondary | ICD-10-CM

## 2020-09-22 DIAGNOSIS — Z0000 Encounter for general adult medical examination without abnormal findings: Secondary | ICD-10-CM | POA: Diagnosis not present

## 2020-09-22 DIAGNOSIS — R1010 Upper abdominal pain, unspecified: Secondary | ICD-10-CM

## 2020-09-22 DIAGNOSIS — F3181 Bipolar II disorder: Secondary | ICD-10-CM

## 2020-09-22 MED ORDER — OMEPRAZOLE 20 MG PO CPDR: 20.0000 mg | DELAYED_RELEASE_CAPSULE | Freq: Two times a day (BID) | ORAL | 0 refills | Status: DC

## 2020-09-22 NOTE — Patient Instructions (Addendum)
Health Maintenance Due  Topic Date Due   HIV Screening - declines for now- thankfully screened in march/April and using protection Never done   Hepatitis C Screening - declines for now Never done   COVID-19 Vaccine (3 - Booster for [employer] series)- recommend at pharmacy 03/09/2020   No labs today per your request  Start omeprazole 20 mg twice daily. Let us know if new or worsening symptoms  We will call you within two weeks about your referral to GI. If you do not hear within 2 weeks, give [place] a call.   Recommended follow up: Return in about 1 year (around 09/22/2021) for physical or sooner if needed. If you have new or worsening symptoms- particularly throwing up blood, blood in stool, dark black stool

## 2020-09-22 NOTE — Assessment & Plan Note (Addendum)
#   Hematemesis/history of GERD S:long term issues with reflux even into childhood. Had a single episode of hematemesis  in April prior to last visit in mayafter taking a supplement called delta 8 cbd variant that made him really nauseous.  We discussed referral to GI if blood in stool, melena, hemetemsis again noted.   Today reports symptoms are worsening- thankfully  no history of hematemesis since last visit. Has had ongoing nausea. Gets upper abdominal pain if waits too long to eat. Can even throw up if waits too long to eat. He stopped his reflux meds again- not sure if symptoms are worse after stopping  Diet is much healthier and losing weight but still no improvement.  A/P: worsening control. with ongoing nausea, upper abdominal pain worse after meals and worsening off PPI- I wonder if he could have a duodenal ulcer- will start on omeprazole 20 mg BID and refer to GI given ongoing issues- consider EGD

## 2020-09-22 NOTE — Progress Notes (Signed)
Phone [phone] In person visit   Subjective:   Cory [name] is a 20 y.o. year old very pleasant male patient who presents for/with See problem oriented charting This visit occurred during the SARS-CoV-2 public health emergency.  Safety protocols were in place, including screening questions prior to the visit, additional usage of staff PPE, and extensive cleaning of exam room while observing appropriate contact time as indicated for disinfecting solutions.   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     pramipexole (MIRAPEX) 1 MG tablet      Vitamin D, Ergocalciferol, (DRISDOL) 1.25 MG (50000 UNIT) CAPS capsule Take 1 capsule (50,000 Units total) by mouth every 7 (seven) days. 12 capsule 0   omeprazole (PRILOSEC) 20 MG capsule Take 1 capsule (20 mg total) by mouth 2 (two) times daily before a meal. 180 capsule 0   No current facility-administered medications for this visit.     Objective:  BP 114/80 (BP Location: Left Arm, Patient Position: Sitting, Cuff Size: Large)   Pulse 68   Temp 98.1 F (36.7 C) (Temporal)   Resp 15   Ht 6\' 1"  (1.854 m)   Wt 217 lb (98.4 kg)   SpO2 96%   BMI 28.63 kg/m   Abdomen: soft/mild tenderness upper abdomen/nondistended/normal bowel sounds. No rebound or guarding.      Assessment and Plan   GERD (gastroesophageal reflux disease) #  Hematemesis/history of GERD S:long term issues with reflux even into childhood. Had a single episode of hematemesis  in April prior to last visit in mayafter taking a supplement called delta 8 cbd variant that made him really nauseous.  We discussed referral to GI if blood in stool, melena, hemetemsis again noted.   Today reports symptoms are worsening- thankfully  no history of hematemesis since last visit. Has had ongoing nausea. Gets upper abdominal pain if waits too long to eat. Can even throw up if waits too long to eat. He stopped his reflux meds again- not sure if symptoms are worse after stopping  Diet is much healthier and losing weight but still no improvement.  A/P: worsening control. with ongoing nausea, upper abdominal pain worse after meals and worsening off PPI- I wonder if he could have a duodenal ulcer- will start on omeprazole 20 mg BID and refer to GI given ongoing issues- consider EGD    Recommended follow up: as needed for acute concerns- GI follow up planned   Lab/Order associations:   ICD-10-CM   1. Gastroesophageal reflux disease, unspecified whether esophagitis present  K21.9 Ambulatory referral to Gastroenterology    2. Upper abdominal pain  R10.10 Ambulatory referral to Gastroenterology        Meds ordered this encounter  Medications   omeprazole (PRILOSEC) 20 MG capsule    Sig: Take 1 capsule (  20 mg total) by mouth 2 (two) times daily before a meal.    Dispense:  180 capsule    Refill:  0    Return precautions advised.  Tana Conch, MD

## 2020-09-22 NOTE — Progress Notes (Signed)
Phone: [phone]   Subjective:  Patient presents today for their annual physical. Chief complaint-noted.   See problem oriented charting- ROS- full  review of systems was completed and negative  except for: a lot of stress at home trying ot care for his father who is declining (agitation, decreased concentration, sad mood, hyperactive at times, anxious, suicidal thoughts but no plan and follows closely with psychiatry), increased activity, appetite down from nausea, fatigue, dental issues, ear pain, hearin gloss, eye itching and redness at times, cough, shortness of breath,abdominal pain, joint pain, muscle aches, seasonal allergies, increased urination but drinks a lot more water now- over a gallon per day  The following were reviewed and entered/updated in epic: Past Medical History:  Diagnosis Date   ADHD    diagnosed by Pediatrician [facility]. record in epic. Vyvanse $RemoveBefor'70mg'fsAwbqwYzsPs$  but also on anxiety medicine and using marijuana- likely should be prescribed by psychiatry   Allergic rhinitis    zyrtec and singulair   Anxiety    [facility] referred to peds psychiatyr. he reports vsiit 07/22/16 planned. buspirone   Back pain    Bipolar 1 disorder (HCC)    Depression    Electronic cigarette use    advised against using these   GERD (gastroesophageal reflux disease)    prilosec $RemoveB'20mg'qJLjgzQI$    Joint pain    SOB (shortness of breath)    Stomach ulcer    Patient Active Problem List   Diagnosis Date Noted   Bipolar II disorder ([place]) 07/25/2016    Priority: High   ADHD     Priority: High   GERD (gastroesophageal reflux disease)     Priority: Medium   Electronic cigarette use     Priority: Medium   Anxiety     Priority: Medium   Vitamin D deficiency 09/22/2020    Priority: Low   SOB (shortness of breath) 08/21/2020   Allergic rhinitis 06/21/2018   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 No pertinent surgical history.  Family History  Problem Relation  Age of Onset   Hyperlipidemia Mother    Depression Mother    Anxiety disorder Mother    Obesity Mother    Obesity Father    Anxiety disorder Father    Depression Father    Cancer Father    Stroke Father    Hyperlipidemia Father    Hypertension Father    Kidney disease Father        renal transplant   Heart disease Father        CAD age 36   Diabetes Father    Benign prostatic hyperplasia Father    Bipolar disorder Father    Sleep apnea Father    Healthy Sister    Healthy Sister    Healthy Brother    Cancer Maternal Grandmother        ? stomach   Other Maternal Grandfather        unknown- never met   Dementia Paternal Grandmother    Kidney disease Paternal Grandfather    Cancer Maternal Uncle        ? colon   Lung cancer Paternal Aunt        smoker   Kidney disease Paternal Uncle        dialysis    Medications- reviewed and updated Current Outpatient Medications  Medication Sig Dispense Refill   lamoTRIgine (LAMICTAL) 200 MG tablet Take 150 mg by mouth 2 (two) times daily.     pramipexole (MIRAPEX) 1  MG tablet      Vitamin D, Ergocalciferol, (DRISDOL) 1.25 MG (50000 UNIT) CAPS capsule Take 1 capsule (50,000 Units total) by mouth every 7 (seven) days. 12 capsule 0   omeprazole (PRILOSEC) 20 MG capsule Take 1 capsule (20 mg total) by mouth 2 (two) times daily before a meal. 180 capsule 0   No current facility-administered medications for this visit.    Allergies-reviewed and updated Allergies  Allergen Reactions   Abilify [Aripiprazole]     Facial twitching, muscle stiffness, insomnia    Social History   Social History Narrative   Single. Lives with mom. May be living with dad (both patients of Dr. Yong Channel- they are divorced)      At [employer]- gap year 2022- plans to start back January 2023      Hobbies: basketball, music   Objective  Objective:  BP 114/80 (BP Location: Left Arm, Patient Position: Sitting, Cuff Size: Large)   Pulse 68   Temp 98.1 F (36.7  C) (Temporal)   Resp 15   Ht $R'6\' 1"'TX$  (1.854 m)   Wt 217 lb (98.4 kg)   SpO2 96%   BMI 28.63 kg/m  Gen: NAD, resting comfortably HEENT: Mucous membranes are moist. Oropharynx normal Neck: no thyromegaly CV: RRR no murmurs rubs or gallops Lungs: CTAB no crackles, wheeze, rhonchi Abdomen: soft/n mild upper abdominal tenderness/nondistended/normal bowel sounds. No rebound or guarding.  Ext: no edema Skin: warm, dry Neuro: grossly normal, moves all extremities, PERRLA    Assessment and Plan  20 y.o. male presenting for annual physical.  Health Maintenance counseling: 1. Anticipatory guidance: Patient counseled regarding regular dental exams -q6 months, eye exams -yearly planne-d has visit in month,  avoiding smoking and second hand smoke- see below he does smoke on and off , limiting alcohol to 2 beverages per day - has not drank in 4 years.   2. Risk factor reduction:  Advised patient of need for regular exercise and diet rich and fruits and vegetables to reduce risk of heart attack and stroke. Exercise- doing basketball at park for an hour or two most days. Diet- Patient has improved on weight from last visit back down from 239 to 217- now in overweight instead of obesity range- had referred to  healthy weight to wellness and advised myfitness pal and regular exercise- he has done very well with this Wt Readings from Last 3 Encounters:  09/22/20 217 lb (98.4 kg)  09/04/20 221 lb (100.2 kg)  08/21/20 232 lb (105.2 kg)  3. Immunizations/screenings/ancillary studies- considering 3rd coivd 19 shot. Declines HIV and HCV screen at this time- wants to avoid for now Immunization History  Administered Date(s) Administered   19-influenza Whole 12/20/2014   DTaP / HiB 01/24/1999, 03/13/1999, 05/29/1999, 11/13/1999, 05/10/2000, 11/20/2002   HPV Quadrivalent 01/07/2011, 01/12/2012, 07/07/2012   Hepatitis A 11/15/2005, 11/21/2006   Hepatitis B Jul 28, 1998, 12/16/1998, 08/12/1999   IPV 01/24/1999,  03/13/1999, 05/29/1999, 11/20/2002   Influenza,inj,Quad PF,6+ Mos 12/20/2014, 12/17/2015   MMR 11/13/1999, 11/20/2002   Meningococcal Mcv4o 07/07/2010, 12/20/2014   PFIZER(Purple Top)SARS-COV-2 Vaccination 07/18/2019, 10/08/2019   Pneumococcal Conjugate-13 11/15/2001   Tdap 11/25/2008, 10/05/2016   Varicella 11/13/1999, 08/06/2005   4. Prostate cancer screening- dad prostate cancer in 40s- we will start screening at 14  5. Colon cancer screening - no family history other than possibly uncle but not reported by patient at early age, start at age 58 6. Skin cancer screening- lower risk due to melanin content. advised regular sunscreen use. Denies worrisome,  changing, or new skin lesions.  7. current smoker - last visit 1-2 on occasion not even everyday- perhaps 2-3 days a week- recommended complete cessation 8. STD screening - reports having screening in march or April due to an incident- thankfully everything was ok.  Using protection since that time.   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 [facility] [place]. On lamictal $RemoveBef'150mg'omNSIfyyUG$  BID and reports reasonable control. Also on mirapex for depresison he reports- see above but a lot of stress caring for husband  # Hematemesis/history of GERD- see problem oriented note  #hyperlipidemia- mild with LDL above 70 S: Medication: none  Lab Results  Component Value Date   CHOL 150 08/21/2020   HDL 42 08/21/2020   LDLCALC 90 08/21/2020   TRIG 94 08/21/2020   CHOLHDL 3.6 08/21/2020   A/P: recommended healthy eating/regular exercise- certainly not in range to consider medicine at this time   #Vitamin D deficiency S: Medication:  on high dose vitamin D through healthy weight to wellness Last vitamin D Lab Results  Component Value Date   VD25OH 18.2 (L) 08/21/2020  A/P: hopefully improving-  continue current  meds- take 1000 units after finishes high dose   # mild ast/ALt elevation with ALT predominance noted byhealthy weight to wellness- consicern for nan alcoholic fatty liver disease- will recheck with labs today Lab Results  Component Value Date   ALT 55 (H) 08/21/2020   AST 51 (H) 08/21/2020   ALKPHOS 113 08/21/2020   BILITOT 0.3 08/21/2020   #reports diffuse joint pain. Some morning stiffness but 15-20 mins max- doubt RA.   Recommended follow up: Return in about 1 year (around 09/22/2021) for physical or sooner if needed.  Lab/Order associations: opts out of labs for now   ICD-10-CM   1. Preventative health care  Z00.00     2. Bipolar II disorder ([place])  F31.81     3. Gastroesophageal reflux disease, unspecified whether esophagitis present  K21.9 Ambulatory referral to Gastroenterology    4. Vitamin D deficiency  E55.9     5. Upper abdominal pain  R10.10 Ambulatory referral to Gastroenterology      Return precautions advised.  Garret Reddish, MD

## 2020-12-01 ENCOUNTER — Other Ambulatory Visit (INDEPENDENT_AMBULATORY_CARE_PROVIDER_SITE_OTHER): Payer: Self-pay | Admitting: Family Medicine

## 2020-12-01 DIAGNOSIS — E559 Vitamin D deficiency, unspecified: Secondary | ICD-10-CM

## 2020-12-22 ENCOUNTER — Other Ambulatory Visit: Payer: Self-pay | Admitting: Family Medicine

## 2021-02-11 ENCOUNTER — Encounter: Payer: Self-pay | Admitting: Family Medicine

## 2021-03-21 ENCOUNTER — Other Ambulatory Visit: Payer: Self-pay | Admitting: Family Medicine

## 2021-09-24 ENCOUNTER — Encounter: Payer: No Typology Code available for payment source | Admitting: Family Medicine

## 2021-10-07 ENCOUNTER — Encounter (INDEPENDENT_AMBULATORY_CARE_PROVIDER_SITE_OTHER): Payer: Self-pay

## 2021-10-10 ENCOUNTER — Encounter: Payer: Self-pay | Admitting: Family Medicine

## 2021-11-23 ENCOUNTER — Encounter: Payer: Self-pay | Admitting: *Deleted

## 2022-02-11 ENCOUNTER — Encounter: Payer: Self-pay | Admitting: *Deleted

## 2022-03-08 ENCOUNTER — Encounter: Payer: Self-pay | Admitting: Family Medicine

## 2022-03-08 ENCOUNTER — Ambulatory Visit (INDEPENDENT_AMBULATORY_CARE_PROVIDER_SITE_OTHER): Payer: No Typology Code available for payment source | Admitting: Family Medicine

## 2022-03-08 VITALS — BP 110/78 | HR 72 | Temp 98.2°F | Ht 73.0 in | Wt 195.2 lb

## 2022-03-08 DIAGNOSIS — Z0000 Encounter for general adult medical examination without abnormal findings: Secondary | ICD-10-CM

## 2022-03-08 DIAGNOSIS — F3181 Bipolar II disorder: Secondary | ICD-10-CM | POA: Diagnosis not present

## 2022-03-08 DIAGNOSIS — Z1159 Encounter for screening for other viral diseases: Secondary | ICD-10-CM

## 2022-03-08 DIAGNOSIS — Z113 Encounter for screening for infections with a predominantly sexual mode of transmission: Secondary | ICD-10-CM

## 2022-03-08 DIAGNOSIS — F902 Attention-deficit hyperactivity disorder, combined type: Secondary | ICD-10-CM | POA: Diagnosis not present

## 2022-03-08 DIAGNOSIS — Z130 Encounter for screening for diseases of the blood and blood-forming organs and certain disorders involving the immune mechanism: Secondary | ICD-10-CM

## 2022-03-08 DIAGNOSIS — Z114 Encounter for screening for human immunodeficiency virus [HIV]: Secondary | ICD-10-CM

## 2022-03-08 DIAGNOSIS — E559 Vitamin D deficiency, unspecified: Secondary | ICD-10-CM

## 2022-03-08 DIAGNOSIS — Z79899 Other long term (current) drug therapy: Secondary | ICD-10-CM

## 2022-03-08 NOTE — Patient Instructions (Addendum)
Team please print labs- he is going to take them to [facility]  We will call you within two weeks about your referral to [facility] psychiatry- you may just want to double check with insurance on coverage. If you do not hear within 2 weeks, give [place] a call.   [email or web address]  Recommended follow up: Return in about 1 year (around 03/09/2023) for physical or sooner if needed.Schedule b4 you leave.

## 2022-03-08 NOTE — Progress Notes (Signed)
Phone: [phone]    Subjective:  Patient presents today for their annual physical. Chief complaint-noted.   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 Marland Kitchen Coughs with CBD and wants another option- wants to see psychiatry. Loose stools with certain food. Some psychiatric concerns- agitation at times concentration, sad mood, anxiety, sleep issues- does see psychiatry but wants to see new one- feels manageable overall  The following were reviewed and entered/updated in epic: Past Medical History:  Diagnosis Date   ADHD    diagnosed by Pediatrician [facility]. record in epic. Vyvanse 70mg  but also on anxiety medicine and using marijuana- likely should be prescribed by psychiatry   Allergic rhinitis    zyrtec and singulair   Anxiety    [facility] referred to peds psychiatyr. he reports vsiit 07/22/16 planned. buspirone   Back pain    Bipolar 1 disorder (HCC)    Depression    Electronic cigarette use    advised against using these   GERD (gastroesophageal reflux disease)    prilosec 20mg    Joint pain    SOB (shortness of breath)    Stomach ulcer    Patient Active Problem List   Diagnosis Date Noted   Bipolar II disorder ([place]) 07/25/2016    Priority: High   ADHD     Priority: High   GERD (gastroesophageal reflux disease)     Priority: Medium    Electronic cigarette use     Priority: Medium    Anxiety     Priority: Medium    Vitamin D deficiency 09/22/2020    Priority: Low   SOB (shortness of breath) 08/21/2020   Allergic rhinitis 06/21/2018   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 No pertinent surgical history.  Family History  Problem Relation Age of Onset   Hyperlipidemia Mother    Depression Mother    Anxiety disorder Mother     Obesity Mother    Obesity Father    Anxiety disorder Father    Depression Father    Cancer Father    Stroke Father    Hyperlipidemia Father    Hypertension Father    Kidney disease Father        renal transplant   Heart disease Father        CAD age 72   Diabetes Father    Benign prostatic hyperplasia Father    Bipolar disorder Father    Sleep apnea Father    Healthy Sister    Healthy Sister    Healthy Brother    Cancer Maternal Grandmother        ? stomach   Other Maternal Grandfather        unknown- never met   Dementia Paternal Grandmother    Kidney disease Paternal Grandfather    Cancer Maternal Uncle        ? colon   Lung cancer Paternal Aunt        smoker   Kidney disease Paternal Uncle        dialysis    Medications- reviewed and updated Current Outpatient Medications  Medication Sig Dispense Refill   lamoTRIgine (LAMICTAL) 200 MG tablet Take 150 mg by mouth 2 (two) times daily.     pramipexole (MIRAPEX) 1 MG tablet      pregabalin (LYRICA) 75 MG capsule  Take 75 mg by mouth 2 (two) times daily.     No current facility-administered medications for this visit.    Allergies-reviewed and updated Allergies  Allergen Reactions   Abilify [Aripiprazole]     Facial twitching, muscle stiffness, insomnia    Social History   Social History Narrative   Single. Lives with mom.       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    Some time at [employer]- gap year started 2022 and holding off for now.       Hobbies: basketball, music      Objective:  BP 110/78   Pulse 72   Temp 98.2 F (36.8 C)   Ht 6\' 1"  (1.854 m)   Wt 195 lb 3.2 oz (88.5 kg)   SpO2 98%   BMI 25.75 kg/m  Gen: NAD, resting comfortably HEENT: Mucous membranes are moist. Oropharynx normal Neck: no thyromegaly CV: RRR no murmurs rubs or gallops Lungs: CTAB no crackles, wheeze, rhonchi Abdomen: soft/nontender/nondistended/normal bowel sounds. No  rebound or guarding.  Ext: no edema Skin: warm, dry Neuro: grossly normal, moves all extremities, PERRLA    Assessment and Plan:  22 y.o. male presenting for annual physical.  Health Maintenance counseling: 1. Anticipatory guidance: Patient counseled regarding regular dental exams - needs to schedule q6 months, eye exams - wears contacts so yearly- seen in last 2 months,  avoiding smoking and second hand smoke, limiting alcohol to 2 beverages per day- doesn't drink, no illicit drugs- some marijuana but mainly just CBD- uses for anxiety.   2. Risk factor reduction:  Advised patient of need for regular exercise and diet rich and fruits and vegetables to reduce risk of heart attack and stroke.  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  Diet/weight management-down 22 lbs in last year- wants to move into maintenance- really most of this was after losing dad in September and now stabilized.  Wt Readings from Last 3 Encounters:  03/08/22 195 lb 3.2 oz (88.5 kg)  09/22/20 217 lb (98.4 kg)  09/04/20 221 lb (100.2 kg)  3. Immunizations/screenings/ancillary studies- declines covid. HCV screen- opts in  Immunization History  Administered Date(s) Administered   19-influenza Whole 12/20/2014   DTaP / HiB 01/24/1999, 03/13/1999, 05/29/1999, 11/13/1999, 05/10/2000, 11/20/2002   HPV Quadrivalent 01/07/2011, 01/12/2012, 07/07/2012   Hepatitis A 11/15/2005, 11/21/2006   Hepatitis B 07-Oct-1998, 12/16/1998, 08/12/1999   IPV 01/24/1999, 03/13/1999, 05/29/1999, 11/20/2002   Influenza,inj,Quad PF,6+ Mos 12/20/2014, 12/17/2015   MMR 11/13/1999, 11/20/2002   Meningococcal Mcv4o 07/07/2010, 12/20/2014   PFIZER(Purple Top)SARS-COV-2 Vaccination 07/18/2019, 10/08/2019   Pneumococcal Conjugate-13 11/15/2001   Tdap 11/25/2008, 10/05/2016   Varicella 11/13/1999, 08/06/2005   4. Prostate cancer screening- dad prostate cancer in 15s- we will start screening at 43   5. Colon cancer screening - no family  history other than possibly uncle but not reported by patient at early age, start at age 28  6. Skin cancer screening- lower risk due to melanin content.  advised regular sunscreen use. Denies worrisome, changing, or new skin lesions.  7. current smoker - e cigarettes only- still advised cessation. At least cut out cigarettes.  8. STD screening - reports having urine screening recently but not bloodwork so we will check HIV and RPR. No visible lesions- herpes or warts related. Not active in 2 years he reports 9. Testicular cancer screening- does self exams  Status of chronic or acute concerns   #Bipolar II/ADHD- mental health  c[facility] for [facility] [place]- on lamictal as well as mirapex as well as lyrica.  - requests referral to [facility]- wants second opinion  # GERD-managed by omeprazole 20 mg last visit-today reports no issues- able to come off meds- wonder if had ulcer   # Screening hyperlipidemia-mild elevations but not in range for medication-repeat every 3 to 5 years  Lab Results  Component Value Date   CHOL 150 08/21/2020   HDL 42 08/21/2020   LDLCALC 90 08/21/2020   TRIG 94 08/21/2020   CHOLHDL 3.6 08/21/2020   #Vitamin D deficiency S: Medication: Treated with high-dose vitamin D in the past-currently not on any  Last vitamin D Lab Results  Component Value Date   VD25OH 18.2 (L) 08/21/2020   A/P: hopefully stable- update vitamin D today. Continue current meds for now   Recommended follow up: Return in about 1 year (around 03/09/2023) for physical or sooner if needed.Schedule b4 you leave.  Lab/Order associations: NOTfasting- prefers to do labs at [facility] so labs printed   ICD-10-CM   1. Preventative health care  Z00.00 CBC with Differential/Platelet    Comprehensive metabolic panel    2. Bipolar II disorder (HCC)  F31.81 Ambulatory referral to Psychiatry    3. Attention deficit hyperactivity disorder (ADHD), combined type  F90.2  Ambulatory referral to Psychiatry    4. Encounter for hepatitis C screening test for low risk patient  Z11.59 Hepatitis C antibody    5. Screening examination for venereal disease  Z11.3 RPR    6. Screening for HIV (human immunodeficiency virus)  Z11.4 HIV Antibody (routine testing w rflx)    7. Vitamin D deficiency  E55.9 Vitamin D (25 hydroxy)    8. Screening, anemia, deficiency, iron  Z13.0 CBC with Differential/Platelet    9. High risk medication use  Z79.899 Comprehensive metabolic panel      No orders of the defined types were placed in this encounter.   Return precautions advised.   Tana Conch, MD

## 2022-03-09 ENCOUNTER — Other Ambulatory Visit: Payer: Self-pay | Admitting: Family Medicine

## 2022-03-09 LAB — BASIC METABOLIC PANEL
BUN: 14 (ref 4–21)
CO2: 24 — AB (ref 13–22)
Chloride: 104 (ref 99–108)
Creatinine: 1.2 (ref 0.6–1.3)
Glucose: 91
Potassium: 4.4 mEq/L (ref 3.5–5.1)
Sodium: 141 (ref 137–147)

## 2022-03-09 LAB — HEPATIC FUNCTION PANEL
ALT: 21 U/L (ref 10–40)
AST: 16 (ref 14–40)

## 2022-03-09 LAB — CBC AND DIFFERENTIAL
HCT: 49 (ref 41–53)
Hemoglobin: 16.2 (ref 13.5–17.5)
Platelets: 301 10*3/uL (ref 150–400)

## 2022-03-09 LAB — COMPREHENSIVE METABOLIC PANEL
Albumin: 4.5 (ref 3.5–5.0)
Calcium: 9.5 (ref 8.7–10.7)
Globulin: 2.5
eGFR: 92

## 2022-03-09 LAB — CBC: RBC: 5.72 — AB (ref 3.87–5.11)

## 2022-03-09 LAB — VITAMIN D 25 HYDROXY (VIT D DEFICIENCY, FRACTURES): Vit D, 25-Hydroxy: 11

## 2022-03-09 LAB — HIV ANTIBODY (ROUTINE TESTING W REFLEX): HIV 1&2 Ab, 4th Generation: NEGATIVE

## 2022-03-11 ENCOUNTER — Other Ambulatory Visit: Payer: Self-pay | Admitting: Family Medicine

## 2022-03-11 LAB — VITAMIN D 25 HYDROXY (VIT D DEFICIENCY, FRACTURES): Vit D, 25-Hydroxy: 11 ng/mL — ABNORMAL LOW (ref 30.0–100.0)

## 2022-03-11 LAB — COMPREHENSIVE METABOLIC PANEL
ALT: 21 IU/L (ref 0–44)
AST: 16 IU/L (ref 0–40)
Albumin/Globulin Ratio: 1.8 (ref 1.2–2.2)
Albumin: 4.5 g/dL (ref 4.3–5.2)
Alkaline Phosphatase: 90 IU/L (ref 44–121)
BUN/Creatinine Ratio: 12 (ref 9–20)
BUN: 14 mg/dL (ref 6–20)
Bilirubin Total: 0.3 mg/dL (ref 0.0–1.2)
CO2: 24 mmol/L (ref 20–29)
Calcium: 9.5 mg/dL (ref 8.7–10.2)
Chloride: 104 mmol/L (ref 96–106)
Creatinine, Ser: 1.15 mg/dL (ref 0.76–1.27)
Globulin, Total: 2.5 g/dL (ref 1.5–4.5)
Glucose: 91 mg/dL (ref 70–99)
Potassium: 4.4 mmol/L (ref 3.5–5.2)
Sodium: 141 mmol/L (ref 134–144)
Total Protein: 7 g/dL (ref 6.0–8.5)
eGFR: 92 mL/min/{1.73_m2} (ref >59)

## 2022-03-11 LAB — CBC WITH DIFFERENTIAL/PLATELET
Basophils Absolute: 0 10*3/uL (ref 0.0–0.2)
Basos: 1 %
EOS (ABSOLUTE): 0.4 10*3/uL (ref 0.0–0.4)
Eos: 12 %
Hematocrit: 48.8 % (ref 37.5–51.0)
Hemoglobin: 16.2 g/dL (ref 13.0–17.7)
Immature Grans (Abs): 0 10*3/uL (ref 0.0–0.1)
Immature Granulocytes: 0 %
Lymphocytes Absolute: 1.7 10*3/uL (ref 0.7–3.1)
Lymphs: 55 %
MCH: 28.3 pg (ref 26.6–33.0)
MCHC: 33.2 g/dL (ref 31.5–35.7)
MCV: 85 fL (ref 79–97)
Monocytes Absolute: 0.2 10*3/uL (ref 0.1–0.9)
Monocytes: 7 %
Neutrophils Absolute: 0.8 10*3/uL — ABNORMAL LOW (ref 1.4–7.0)
Neutrophils: 25 %
Platelets: 301 10*3/uL (ref 150–450)
RBC: 5.72 x10E6/uL (ref 4.14–5.80)
RDW: 12.8 % (ref 11.6–15.4)
WBC: 3.1 10*3/uL — ABNORMAL LOW (ref 3.4–10.8)

## 2022-03-11 LAB — HIV ANTIBODY (ROUTINE TESTING W REFLEX): HIV Screen 4th Generation wRfx: NONREACTIVE

## 2022-03-11 LAB — HEPATITIS C ANTIBODY: Hep C Virus Ab: NONREACTIVE

## 2022-03-11 LAB — RPR: RPR Ser Ql: NONREACTIVE

## 2022-03-11 MED ORDER — VITAMIN D (ERGOCALCIFEROL) 1.25 MG (50000 UNIT) PO CAPS: 50000.0000 [IU] | ORAL_CAPSULE | ORAL | 1 refills | Status: DC

## 2022-03-15 ENCOUNTER — Encounter: Payer: Self-pay | Admitting: Family Medicine

## 2022-04-29 ENCOUNTER — Ambulatory Visit: Payer: No Typology Code available for payment source | Admitting: Family Medicine

## 2022-05-03 IMAGING — DX DG SHOULDER 2+V*R*
3 series · 3 of 3 positions shown · non-contrast
Comparison: 08/12/2016

CLINICAL DATA: Chronic shoulder pain

EXAM:
RIGHT SHOULDER - 2+ VIEW

[shoulder ap (1 of 2)]
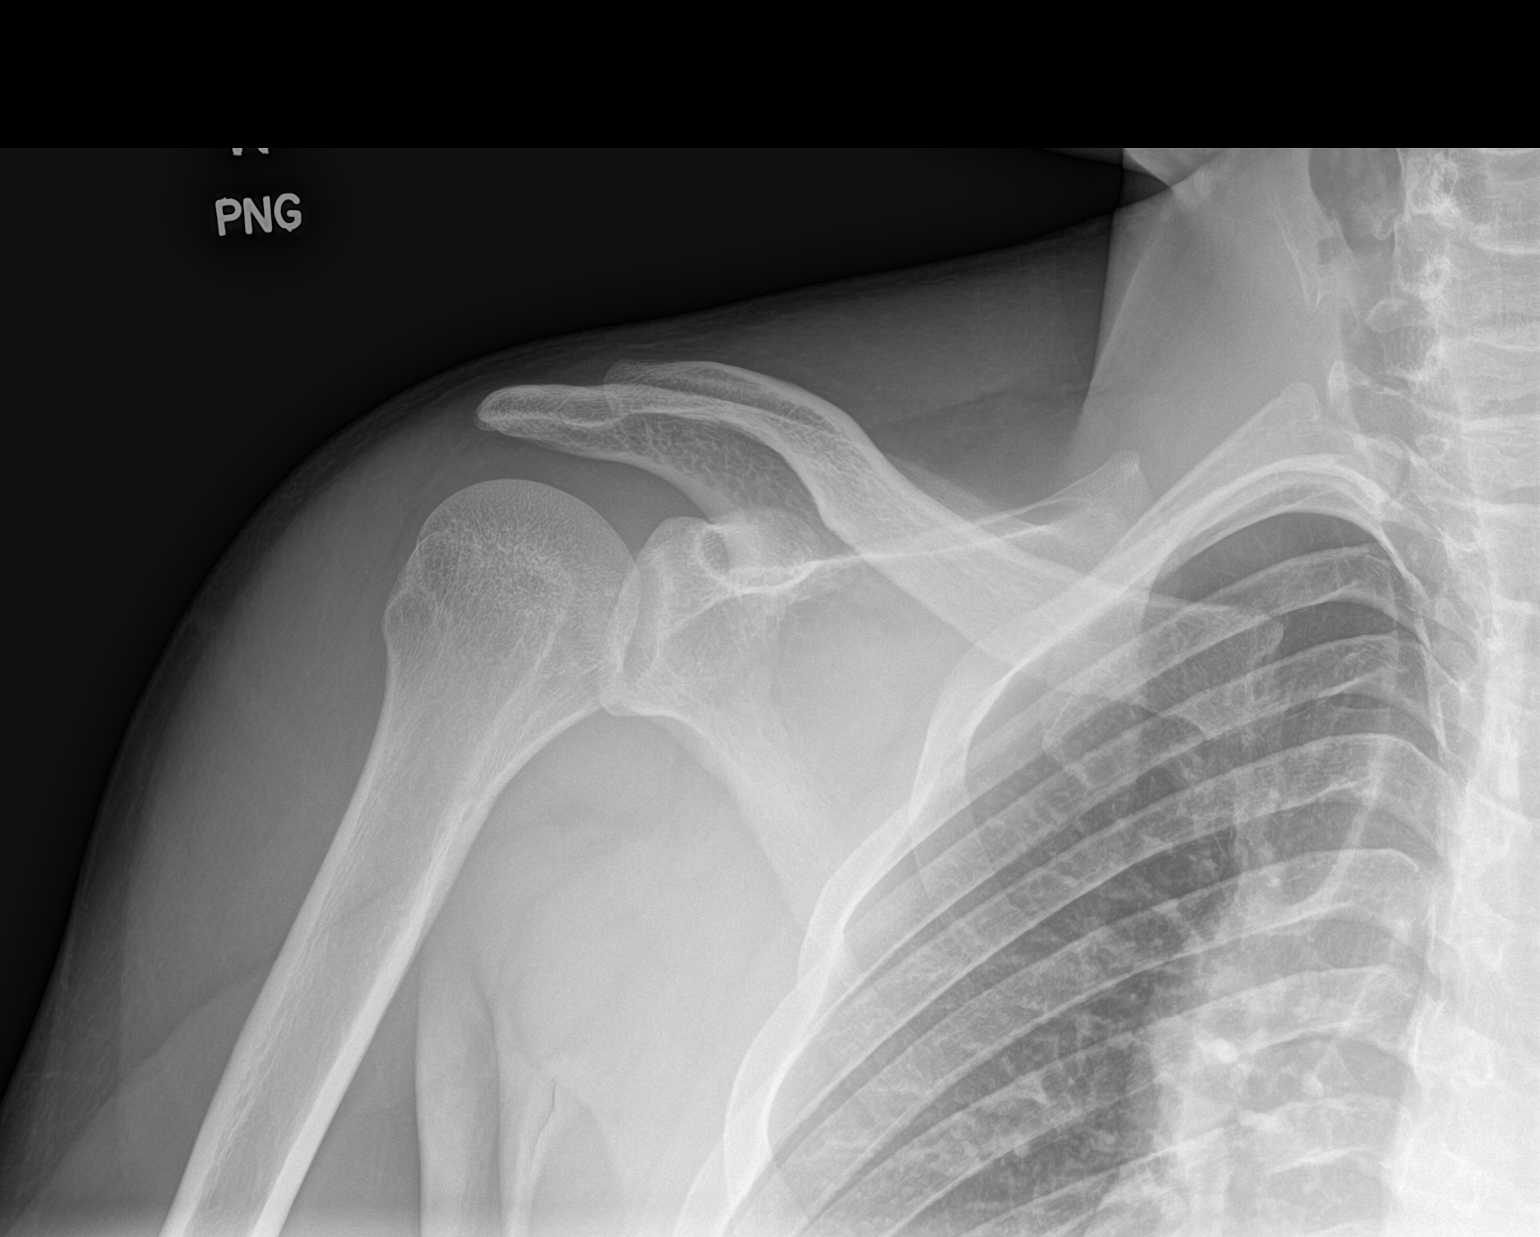

[shoulder ap (2 of 2)]
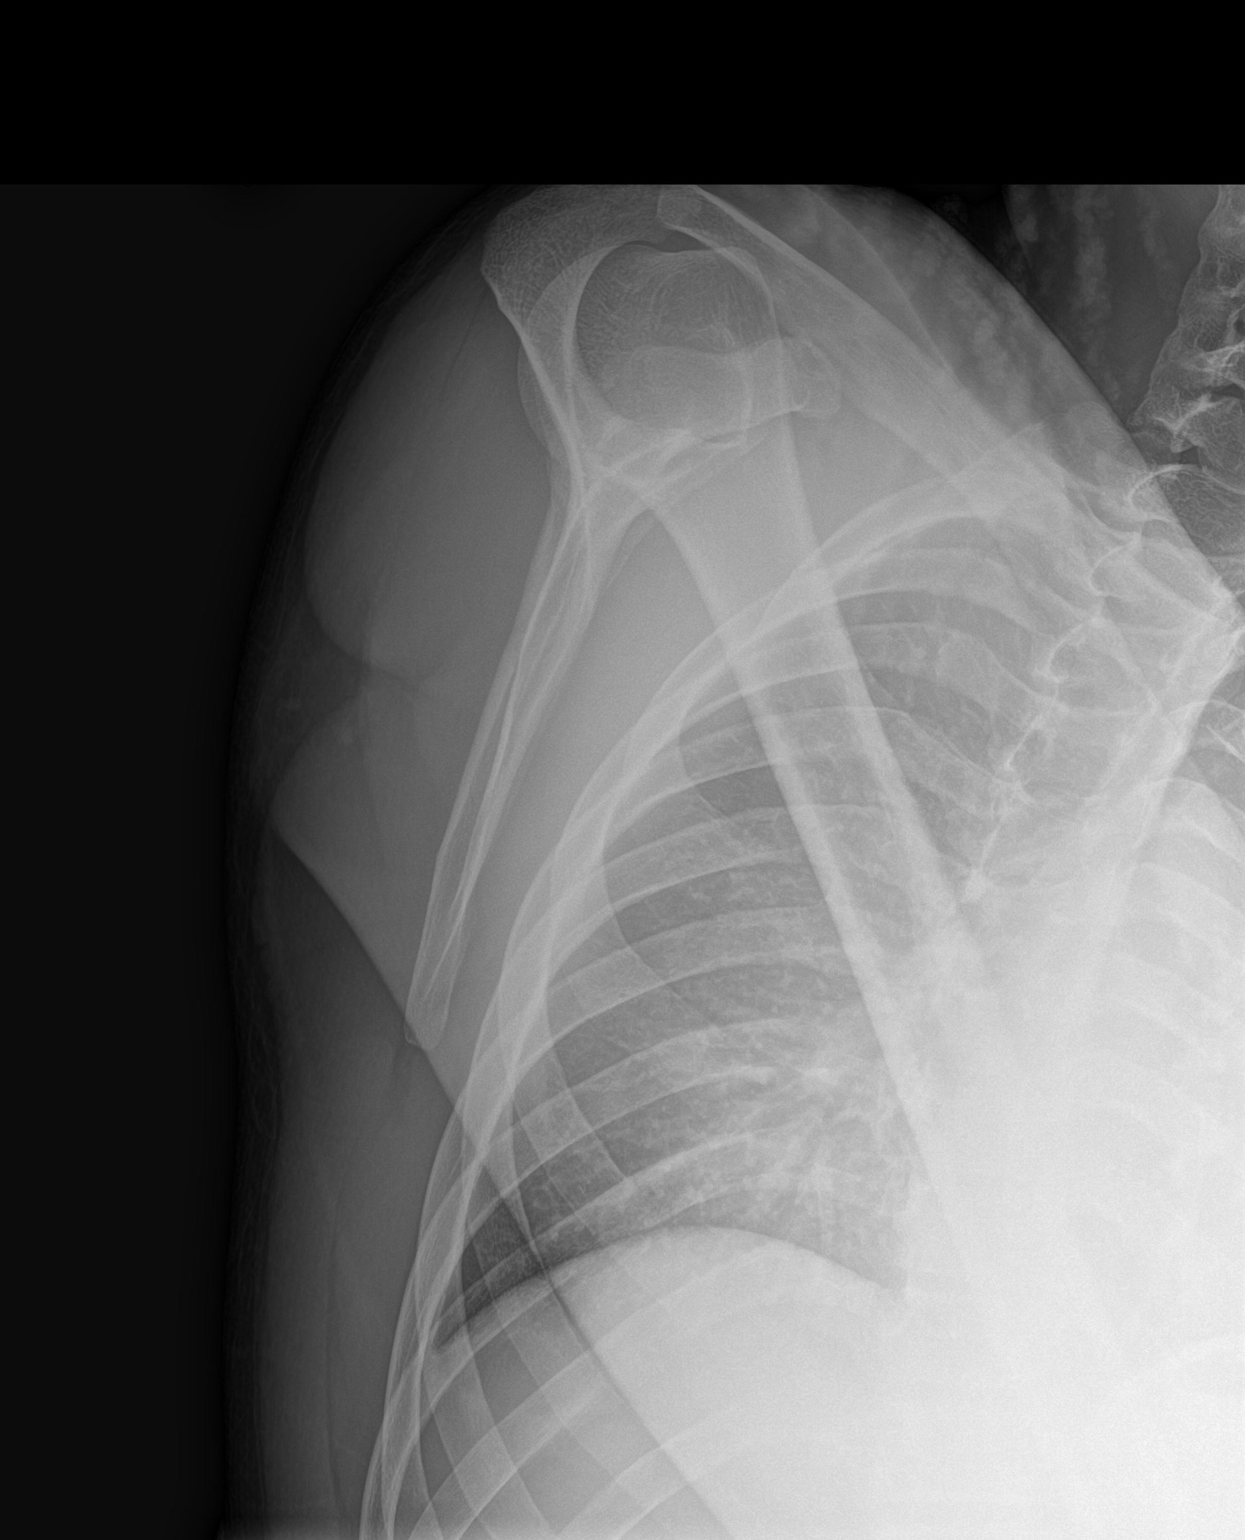

[shoulder axial]
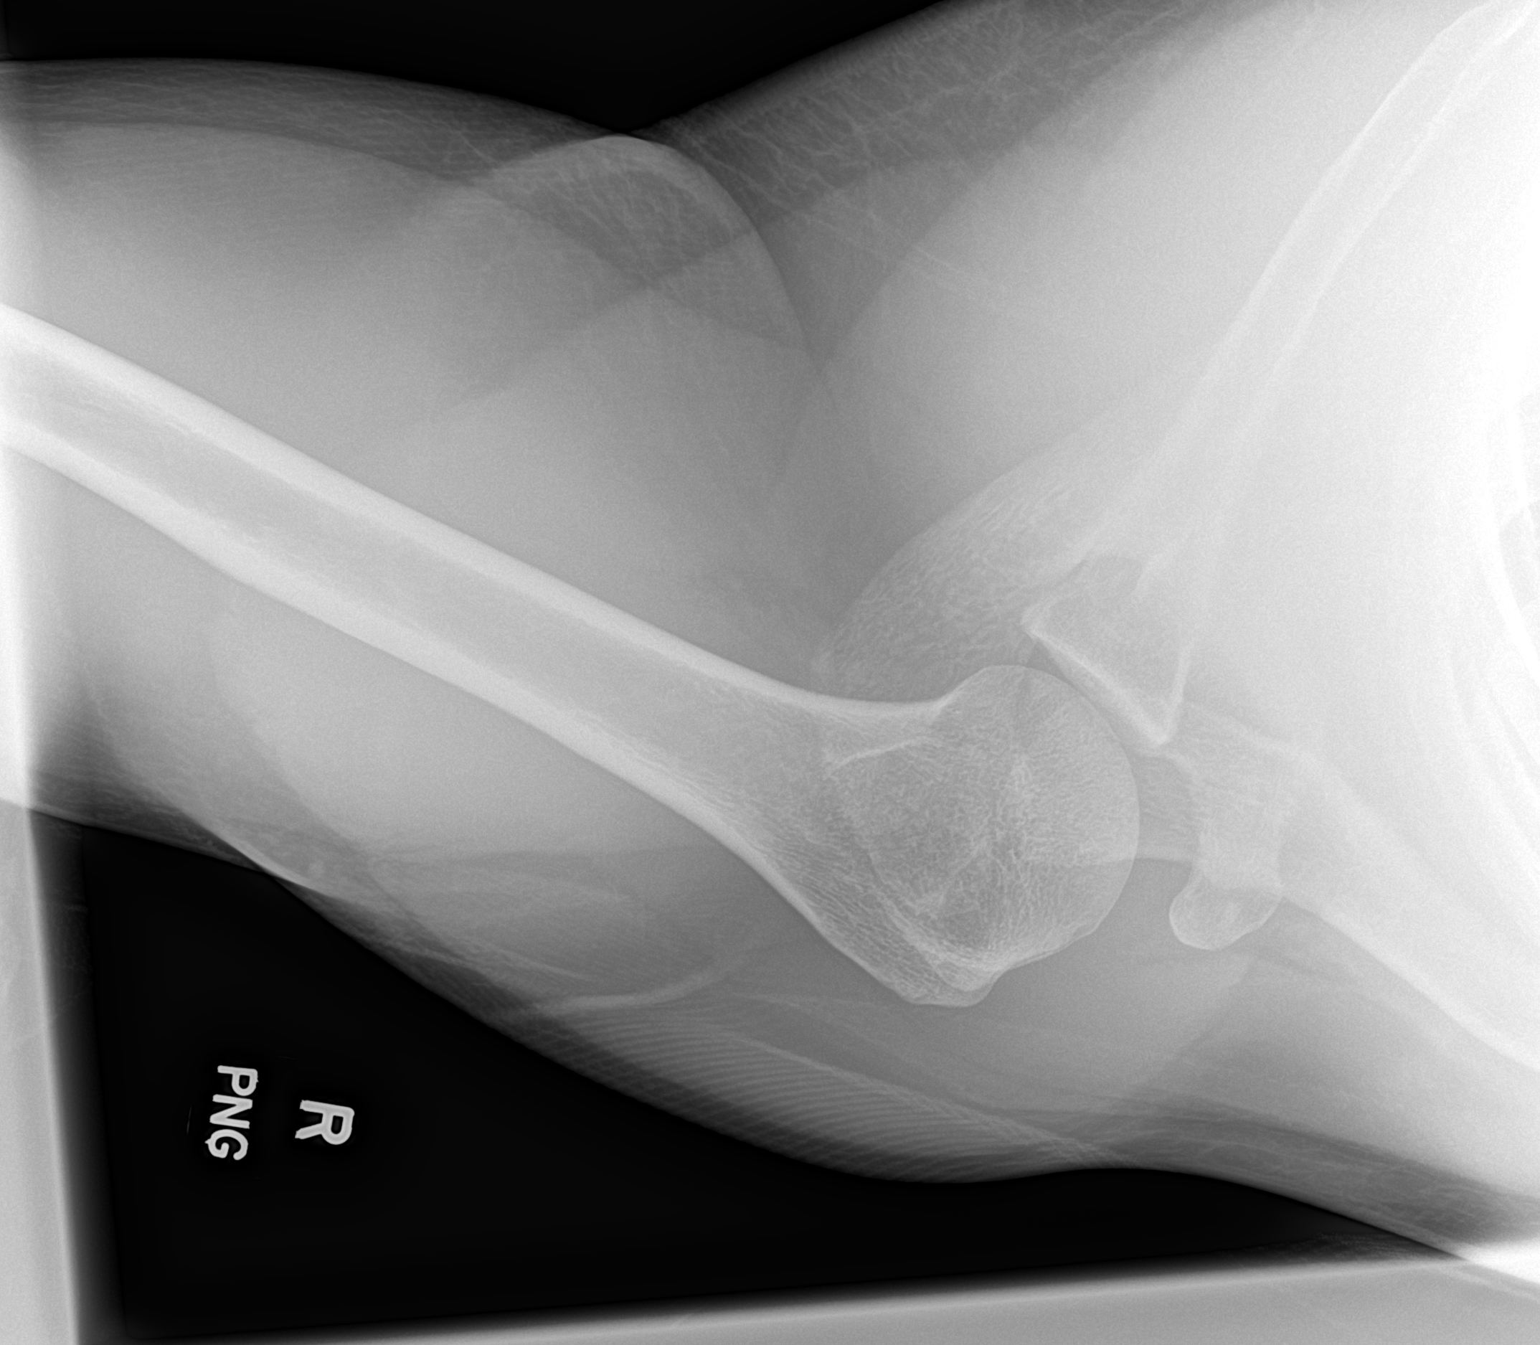

[3 of 3 positions shown; findings below may reference images not displayed]

FINDINGS: There is no evidence of fracture or dislocation. There is no
evidence of arthropathy or other focal bone abnormality. Soft
tissues are unremarkable.
IMPRESSION: Negative.

## 2023-03-10 ENCOUNTER — Ambulatory Visit (INDEPENDENT_AMBULATORY_CARE_PROVIDER_SITE_OTHER): Payer: No Typology Code available for payment source | Admitting: Family Medicine

## 2023-03-10 ENCOUNTER — Encounter: Payer: Self-pay | Admitting: Family Medicine

## 2023-03-10 VITALS — BP 104/70 | HR 67 | Temp 97.5°F | Ht 73.0 in | Wt 200.4 lb

## 2023-03-10 DIAGNOSIS — G8929 Other chronic pain: Secondary | ICD-10-CM

## 2023-03-10 DIAGNOSIS — Z113 Encounter for screening for infections with a predominantly sexual mode of transmission: Secondary | ICD-10-CM

## 2023-03-10 DIAGNOSIS — M25561 Pain in right knee: Secondary | ICD-10-CM

## 2023-03-10 DIAGNOSIS — Z0000 Encounter for general adult medical examination without abnormal findings: Secondary | ICD-10-CM

## 2023-03-10 DIAGNOSIS — M25552 Pain in left hip: Secondary | ICD-10-CM

## 2023-03-10 DIAGNOSIS — M25572 Pain in left ankle and joints of left foot: Secondary | ICD-10-CM

## 2023-03-10 DIAGNOSIS — Z114 Encounter for screening for human immunodeficiency virus [HIV]: Secondary | ICD-10-CM

## 2023-03-10 DIAGNOSIS — M25551 Pain in right hip: Secondary | ICD-10-CM

## 2023-03-10 DIAGNOSIS — Z13220 Encounter for screening for lipoid disorders: Secondary | ICD-10-CM | POA: Diagnosis not present

## 2023-03-10 DIAGNOSIS — E559 Vitamin D deficiency, unspecified: Secondary | ICD-10-CM | POA: Diagnosis not present

## 2023-03-10 DIAGNOSIS — M25562 Pain in left knee: Secondary | ICD-10-CM

## 2023-03-10 DIAGNOSIS — Z118 Encounter for screening for other infectious and parasitic diseases: Secondary | ICD-10-CM

## 2023-03-10 DIAGNOSIS — F3181 Bipolar II disorder: Secondary | ICD-10-CM

## 2023-03-10 DIAGNOSIS — M25571 Pain in right ankle and joints of right foot: Secondary | ICD-10-CM

## 2023-03-10 NOTE — Patient Instructions (Addendum)
 Schedule a lab visit at the check out desk within 2 weeks. Return for future fasting labs meaning nothing but water after midnight please. Ok to take your medications with water.   We have placed a referral for you today to [facility]. In some cases you will see # listed below- you can call this if you have not heard within a week. If you do not see # listed- you should receive a mychart message or phone call within a week with the # to call directly- call that as soon as you get it. If you are having issues getting scheduled reach out to us  again.    Recommended follow up: Return in about 1 year (around 03/09/2024) for physical or sooner if needed.Schedule b4 you leave.

## 2023-03-10 NOTE — Progress Notes (Signed)
 Phone: [phone]    Subjective:  Patient presents today for their annual physical. Chief complaint-noted.   See problem oriented charting- ROS- full  review of systems was completed and negative  except for: skin infections, reflux, activity or appetite change if irregular with medications- can cause fatigue, some ringing in ears with loud musci, cough with smoking, loose stools, joint pain, may have agitation or confusion if missing medications- encouraged consistency  - hard with his schedule  The following were reviewed and entered/updated in epic: Past Medical History:  Diagnosis Date   ADHD    diagnosed by Pediatrician [facility]. record in epic. Vyvanse  70mg  but also on anxiety medicine and using marijuana- likely should be prescribed by psychiatry   Allergic rhinitis    zyrtec and singulair    Anxiety    [facility] referred to peds psychiatyr. he reports vsiit 07/22/16 planned. buspirone    Back pain    Bipolar 1 disorder (HCC)    Depression    Electronic cigarette use    advised against using these   GERD (gastroesophageal reflux disease)    prilosec 20mg    Joint pain    SOB (shortness of breath)    Stomach ulcer    Patient Active Problem List   Diagnosis Date Noted   Bipolar II disorder (HCC) 07/25/2016    Priority: High   ADHD     Priority: High   GERD (gastroesophageal reflux disease)     Priority: Medium    Electronic cigarette use     Priority: Medium    Anxiety     Priority: Medium    Vitamin D  deficiency 09/22/2020    Priority: Low   SOB (shortness of breath) 08/21/2020   Allergic rhinitis 06/21/2018   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 No pertinent surgical history.  Family History  Problem Relation Age of Onset   Hyperlipidemia Mother    Depression Mother    Anxiety disorder Mother    Obesity Mother    Obesity Father    Anxiety disorder Father    Depression Father    Cancer Father    Stroke Father     Hyperlipidemia Father    Hypertension Father    Kidney disease Father        renal transplant   Heart disease Father        CAD age 37   Diabetes Father    Benign prostatic hyperplasia Father    Bipolar disorder Father    Sleep apnea Father    Healthy Sister    Healthy Sister    Healthy Brother    Cancer Maternal Grandmother        ? stomach   Other Maternal Grandfather        unknown- never met   Dementia Paternal Grandmother    Kidney disease Paternal Grandfather    Cancer Maternal Uncle        ? colon   Lung cancer Paternal Aunt        smoker   Kidney disease Paternal Uncle        dialysis    Medications- reviewed and updated Current Outpatient Medications  Medication Sig Dispense Refill   hydrOXYzine (VISTARIL) 25 MG capsule Take 25 mg by mouth 3 (three) times daily.     lamoTRIgine (LAMICTAL) 200 MG tablet Take 150 mg by mouth 2 (two) times daily.     pramipexole (MIRAPEX) 1 MG tablet      pregabalin (LYRICA) 75 MG capsule  Take 75 mg by mouth 2 (two) times daily.     Vitamin D , Ergocalciferol , (DRISDOL ) 1.25 MG (50000 UNIT) CAPS capsule Take 1 capsule (50,000 Units total) by mouth every 7 (seven) days. 13 capsule 1   No current facility-administered medications for this visit.    Allergies-reviewed and updated Allergies  Allergen Reactions   Abilify  [Aripiprazole ]     Facial twitching, muscle stiffness, insomnia    Social History   Social History Narrative   Single. Lives with mom.       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       Hobbies: basketball, music      Objective:  BP 104/70   Pulse 67   Temp (!) 97.5 F (36.4 C)   Ht 6' 1 (1.854 m)   Wt 200 lb 6.4 oz (90.9 kg)   SpO2 98%   BMI 26.44 kg/m  Gen: NAD, resting comfortably HEENT: Mucous membranes are moist. Oropharynx normal Neck: no thyromegaly CV: RRR no murmurs rubs or gallops Lungs: CTAB no  crackles, wheeze, rhonchi Abdomen: soft/nontender/nondistended/normal bowel sounds. No rebound or guarding.  Ext: no edema Skin: warm, dry Neuro: grossly normal, moves all extremities, PERRLA Declines genitourinary exam. We looked at right buttocks- does have slight scar from July reported abscess MSK tender to palpation of medial and lateral joint line on bilateral knees.  Tender palpation to both medial and lateral malleolus on the ankles.  Reports groin pain bilaterally with hip motion    Assessment and Plan:  23 y.o. male presenting for annual physical.  Health Maintenance counseling: 1. Anticipatory guidance: Patient counseled regarding regular dental exams - needs to schedule q6 months, eye exams - yearly for contacts,  avoiding smoking and second hand smoke , limiting alcohol to 2 beverages per day- not drinking, no illicit drugs other than some marijuana but mainly cannabidiol (CBD) for anxiety- advised against marijuana.   2. Risk factor reduction:  Advised patient of need for regular exercise and diet rich and fruits and vegetables to reduce risk of heart attack and stroke.  Exercise- very active with work still with 15k steps around 20 days a month.  Diet/weight management-prior down 22 lbs but moved into maintenance and up 5 lbs.  Wt Readings from Last 3 Encounters:  03/10/23 200 lb 6.4 oz (90.9 kg)  03/08/22 195 lb 3.2 oz (88.5 kg)  09/22/20 217 lb (98.4 kg)  3. Immunizations/screenings/ancillary studies- declines flu and covid Immunization History  Administered Date(s) Administered   19-influenza Whole 12/20/2014   DTaP / HiB 01/24/1999, 03/13/1999, 05/29/1999, 11/13/1999, 05/10/2000, 11/20/2002   HPV Quadrivalent 01/07/2011, 01/12/2012, 07/07/2012   Hepatitis A 11/15/2005, 11/21/2006   Hepatitis B 08-31-1998, 12/16/1998, 08/12/1999   IPV 01/24/1999, 03/13/1999, 05/29/1999, 11/20/2002   Influenza,inj,Quad PF,6+ Mos 12/20/2014, 12/17/2015   MMR 11/13/1999, 11/20/2002    Meningococcal Mcv4o 07/07/2010, 12/20/2014   PFIZER(Purple Top)SARS-COV-2 Vaccination 07/18/2019, 10/08/2019   Pneumococcal Conjugate-13 11/15/2001   Tdap 11/25/2008, 10/05/2016   Varicella 11/13/1999, 08/06/2005  4. Prostate cancer screening- father with prostate cancer in his 50s.  We will start screening at 45  5. Colon cancer screening - uncle may have had some colon issues but not an early age. no first-degree family history, start at age 27 6. Skin cancer screening/prevention- lower risk due to melanin content advised regular sunscreen use. Denies worrisome, changing, or new skin lesions.  7. Testicular cancer screening- advised monthly  self exams  8. STD screening- patient opts in-denies visible lesion such as herpes or warts. Not recently active but wants to be on safe side.   9. Smoking associated screening-former smoker-does use e-cigarettes-advised cessation  Status of chronic or acute concerns   #Hip/knee/ankle pain- was noting pain shooting from leg up to hip- got insoles and helped for a bit but then started worsening again 3 weeks later so no long term sustained relief- pain since 15- but 5-6 months ago seemed to flare. Pain all day long. Mildly worse in morning. General stiffness. Tried physical therapy in past. Has not seen orthopedics.  - open to orthopedic referral.  Did wrestle previously and we wonder if that contributes  #skin infections- reports staph inection/abscess concern in July. Has had some more mild ones since that time. Feels doing better now though- wants to hold off on decolonization  #Bipolar II/ADHD- prior mental health cared for by [facility]ry [facility] [place]- on lamictal as well as mirapex as well as lyrica. Hydroxyzine as needed  - last year referral to [facility]- wants second opinion- has bene a better fit for him    # GERD-managed by omeprazole  20 mg 2 visits ago but last year had weaned off. Mild issues if off on taking psychiatry  medications otherwise does well- doesn't feel need for daily medicine   # Screening hyperlipidemia-mild elevations but not in range for medication -check today  #Vitamin D  deficiency S: Medication: prior high dose - only took 3 months then stopped- currently off Last vitamin D  Lab Results  Component Value Date   VD25OH 11.0 (L) 03/09/2022  A/P: I'm concerned may be low- update with labs today  Recommended follow up: Return in about 1 year (around 03/09/2024) for physical or sooner if needed.Schedule b4 you leave.  Lab/Order associations:NOT fasting   ICD-10-CM   1. Preventative health care  Z00.00     2. Screening for gonorrhea  Z11.3 Urine cytology ancillary only    3. Screening for chlamydial disease  Z11.8 Urine cytology ancillary only    4. Screening for HIV (human immunodeficiency virus)  Z11.4 HIV Antibody (routine testing w rflx)    5. Screening examination for venereal disease  Z11.3 RPR    6. Screening for hyperlipidemia  Z13.220 Comprehensive metabolic panel    Lipid panel    7. Screen for STD (sexually transmitted disease)  Z11.3 HIV Antibody (routine testing w rflx)    RPR    Urine cytology ancillary only    8. Vitamin D  deficiency  E55.9 VITAMIN D  25 Hydroxy (Vit-D Deficiency, Fractures)    9. Bipolar II disorder (HCC)  F31.81 CBC with Differential/Platelet      No orders of the defined types were placed in this encounter.   Return precautions advised.   Garnette Lukes, MD

## 2023-03-13 ENCOUNTER — Encounter: Payer: Self-pay | Admitting: Family Medicine

## 2023-03-14 ENCOUNTER — Other Ambulatory Visit (HOSPITAL_COMMUNITY)
Admission: RE | Admit: 2023-03-14 | Discharge: 2023-03-14 | Disposition: A | Discharge status: Home / Self Care | Payer: No Typology Code available for payment source | Source: Ambulatory Visit | Attending: Family Medicine | Admitting: Family Medicine

## 2023-03-14 ENCOUNTER — Other Ambulatory Visit (INDEPENDENT_AMBULATORY_CARE_PROVIDER_SITE_OTHER): Payer: No Typology Code available for payment source

## 2023-03-14 ENCOUNTER — Other Ambulatory Visit: Payer: Self-pay | Admitting: Family Medicine

## 2023-03-14 DIAGNOSIS — E559 Vitamin D deficiency, unspecified: Secondary | ICD-10-CM | POA: Diagnosis not present

## 2023-03-14 DIAGNOSIS — Z13220 Encounter for screening for lipoid disorders: Secondary | ICD-10-CM | POA: Diagnosis not present

## 2023-03-14 DIAGNOSIS — F3181 Bipolar II disorder: Secondary | ICD-10-CM

## 2023-03-14 DIAGNOSIS — Z118 Encounter for screening for other infectious and parasitic diseases: Secondary | ICD-10-CM | POA: Diagnosis present

## 2023-03-14 DIAGNOSIS — Z113 Encounter for screening for infections with a predominantly sexual mode of transmission: Secondary | ICD-10-CM

## 2023-03-14 DIAGNOSIS — Z114 Encounter for screening for human immunodeficiency virus [HIV]: Secondary | ICD-10-CM

## 2023-03-14 LAB — COMPREHENSIVE METABOLIC PANEL
ALT: 30 U/L (ref 0–53)
AST: 20 U/L (ref 0–37)
Albumin: 4.7 g/dL (ref 3.5–5.2)
Alkaline Phosphatase: 100 U/L (ref 39–117)
BUN: 14 mg/dL (ref 6–23)
CO2: 32 meq/L (ref 19–32)
Calcium: 9.7 mg/dL (ref 8.4–10.5)
Chloride: 103 meq/L (ref 96–112)
Creatinine, Ser: 1.24 mg/dL (ref 0.40–1.50)
GFR: 81.48 mL/min (ref >60.00)
Glucose, Bld: 87 mg/dL (ref 70–99)
Potassium: 3.9 meq/L (ref 3.5–5.1)
Sodium: 141 meq/L (ref 135–145)
Total Bilirubin: 0.7 mg/dL (ref 0.2–1.2)
Total Protein: 7.7 g/dL (ref 6.0–8.3)

## 2023-03-14 LAB — CBC WITH DIFFERENTIAL/PLATELET
Basophils Absolute: 0 10*3/uL (ref 0.0–0.1)
Basophils Relative: 0.7 % (ref 0.0–3.0)
Eosinophils Absolute: 0.4 10*3/uL (ref 0.0–0.7)
Eosinophils Relative: 11.1 % — ABNORMAL HIGH (ref 0.0–5.0)
HCT: 51.7 % (ref 39.0–52.0)
Hemoglobin: 17 g/dL (ref 13.0–17.0)
Lymphocytes Relative: 53.7 % — ABNORMAL HIGH (ref 12.0–46.0)
Lymphs Abs: 1.8 10*3/uL (ref 0.7–4.0)
MCHC: 33 g/dL (ref 30.0–36.0)
MCV: 87.3 fL (ref 78.0–100.0)
Monocytes Absolute: 0.2 10*3/uL (ref 0.1–1.0)
Monocytes Relative: 6.6 % (ref 3.0–12.0)
Neutro Abs: 0.9 10*3/uL — ABNORMAL LOW (ref 1.4–7.7)
Neutrophils Relative %: 27.9 % — ABNORMAL LOW (ref 43.0–77.0)
Platelets: 295 10*3/uL (ref 150.0–400.0)
RBC: 5.92 Mil/uL — ABNORMAL HIGH (ref 4.22–5.81)
RDW: 13.8 % (ref 11.5–15.5)
WBC: 3.3 10*3/uL — ABNORMAL LOW (ref 4.0–10.5)

## 2023-03-14 LAB — LIPID PANEL
Cholesterol: 176 mg/dL (ref 0–200)
HDL: 45.5 mg/dL (ref >39.00)
LDL Cholesterol: 120 mg/dL — ABNORMAL HIGH (ref 0–99)
NonHDL: 130.36
Total CHOL/HDL Ratio: 4
Triglycerides: 51 mg/dL (ref 0.0–149.0)
VLDL: 10.2 mg/dL (ref 0.0–40.0)

## 2023-03-14 LAB — VITAMIN D 25 HYDROXY (VIT D DEFICIENCY, FRACTURES): VITD: 9.72 ng/mL — ABNORMAL LOW (ref 30.00–100.00)

## 2023-03-14 MED ORDER — VITAMIN D (ERGOCALCIFEROL) 1.25 MG (50000 UNIT) PO CAPS: 50000.0000 [IU] | ORAL_CAPSULE | ORAL | 1 refills | Status: DC

## 2023-03-15 LAB — URINE CYTOLOGY ANCILLARY ONLY
Chlamydia: NEGATIVE
Comment: NEGATIVE
Comment: NEGATIVE
Comment: NORMAL
Neisseria Gonorrhea: NEGATIVE
Trichomonas: NEGATIVE

## 2023-03-15 LAB — HIV ANTIBODY (ROUTINE TESTING W REFLEX): HIV 1&2 Ab, 4th Generation: NONREACTIVE

## 2023-03-15 LAB — RPR: RPR Ser Ql: NONREACTIVE

## 2023-03-16 ENCOUNTER — Encounter: Payer: Self-pay | Admitting: Orthopaedic Surgery

## 2023-03-16 ENCOUNTER — Ambulatory Visit (INDEPENDENT_AMBULATORY_CARE_PROVIDER_SITE_OTHER): Payer: No Typology Code available for payment source | Admitting: Orthopaedic Surgery

## 2023-03-16 ENCOUNTER — Other Ambulatory Visit (INDEPENDENT_AMBULATORY_CARE_PROVIDER_SITE_OTHER): Payer: No Typology Code available for payment source

## 2023-03-16 ENCOUNTER — Other Ambulatory Visit (INDEPENDENT_AMBULATORY_CARE_PROVIDER_SITE_OTHER): Payer: Self-pay

## 2023-03-16 DIAGNOSIS — M25572 Pain in left ankle and joints of left foot: Secondary | ICD-10-CM

## 2023-03-16 DIAGNOSIS — M25571 Pain in right ankle and joints of right foot: Secondary | ICD-10-CM

## 2023-03-16 DIAGNOSIS — G8929 Other chronic pain: Secondary | ICD-10-CM | POA: Diagnosis not present

## 2023-03-16 NOTE — Progress Notes (Signed)
 Office Visit Note   Patient: Solly Damme           Date of Birth: 29-Jun-2000           MRN: [id number] Visit Date: 03/16/2023              Requested by: Almira Jaeger, MD [address] [place],  [place] [address] PCP: Almira Jaeger, MD   Assessment & Plan: Visit Diagnoses:  1. Chronic pain of both ankles     Plan: Anthon is a 23 year old gentleman with bilateral lower extremity weakness and deconditioning.  I will make referral to physical therapy for strengthening.  X-rays are normal and no structural abnormalities.  Follow-up as needed.  Follow-Up Instructions: No follow-ups on file.   Orders:  Orders Placed This Encounter  Procedures   XR Ankle Complete Left   XR Ankle Complete Right   Ambulatory referral to Physical Therapy   No orders of the defined types were placed in this encounter.     Procedures: No procedures performed   Clinical Data: No additional findings.   Subjective: Chief Complaint  Patient presents with   Right Ankle - Pain   Left Ankle - Pain    HPI Randarius is a 23 year old gentleman here for evaluation of bilateral ankle pain and weakness.  This started about 7 years ago when he was hospitalized for a suicide attempt.  Sounds like he tried to starve himself.  He states that he has pain throughout his hips and ankles.  Denies any particular injuries.  Review of Systems  Constitutional: Negative.   HENT: Negative.    Eyes: Negative.   Respiratory: Negative.    Cardiovascular: Negative.   Gastrointestinal: Negative.   Endocrine: Negative.   Genitourinary: Negative.   Skin: Negative.   Allergic/Immunologic: Negative.   Neurological: Negative.   Hematological: Negative.   Psychiatric/Behavioral: Negative.    All other systems reviewed and are negative.    Objective: Vital Signs: There were no vitals taken for this visit.  Physical Exam Vitals and nursing note reviewed.  Constitutional:      Appearance: He is  well-developed.  HENT:     Head: Normocephalic and atraumatic.  Eyes:     Pupils: Pupils are equal, round, and reactive to light.  Pulmonary:     Effort: Pulmonary effort is normal.  Abdominal:     Palpations: Abdomen is soft.  Musculoskeletal:        General: Normal range of motion.     Cervical back: Neck supple.  Skin:    General: Skin is warm.  Neurological:     Mental Status: He is alert and oriented to person, place, and time.  Psychiatric:        Behavior: Behavior normal.        Thought Content: Thought content normal.        Judgment: Judgment normal.     Ortho Exam Examination of bilateral lower extremities shows normal range of motion of the hip knee and ankle.  There is no instability.  Negative provocative signs of the hip knee and ankles. Specialty Comments:  No specialty comments available.  Imaging: XR Ankle Complete Left Result Date: 03/16/2023 No acute or structural abnormalities  XR Ankle Complete Right Result Date: 03/16/2023 X-rays of the right ankle show no acute findings.    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 record in epic. Vyvanse  70mg  but also on anxiety medicine and using marijuana- likely should be prescribed by psychiatry   Allergic rhinitis    zyrtec and singulair    Anxiety    [facility] referred to peds psychiatyr. he reports vsiit 07/22/16 planned. buspirone    Back pain    Bipolar 1 disorder (HCC)    Depression    Electronic cigarette use    advised against using these   GERD (gastroesophageal reflux disease)    prilosec  20mg    Joint pain    SOB (shortness of breath)    Stomach ulcer     Family History  Problem Relation Age of Onset   Hyperlipidemia Mother    Depression Mother    Anxiety disorder Mother    Obesity Mother    Obesity Father    Anxiety disorder Father    Depression Father    Cancer Father    Stroke Father    Hyperlipidemia Father    Hypertension Father    Kidney disease Father        renal transplant   Heart disease Father        CAD age 67   Diabetes Father    Benign prostatic hyperplasia Father    Bipolar disorder Father    Sleep apnea Father    Healthy Sister    Healthy Sister    Healthy Brother    Cancer Maternal Grandmother        ? stomach   Other Maternal Grandfather        unknown- never met   Dementia Paternal Grandmother    Kidney disease Paternal Grandfather    Cancer Maternal Uncle        ? colon   Lung cancer Paternal Aunt        smoker   Kidney disease Paternal Uncle        dialysis    History reviewed. No pertinent surgical history. Social History   Occupational History   Occupation: Production assistant, radio  Tobacco Use   Smoking status: Former    Types: Cigarettes   Smokeless tobacco: Never   Tobacco comments:    1 to 2 cigarettes a day   Vaping Use   Vaping status: Former  Substance and Sexual Activity   Alcohol use: No   Drug use: Yes    Types: Marijuana   Sexual activity: Yes    Birth control/protection: Condom

## 2023-03-23 ENCOUNTER — Other Ambulatory Visit: Payer: No Typology Code available for payment source

## 2023-08-09 ENCOUNTER — Encounter: Payer: Self-pay | Admitting: Family Medicine

## 2023-08-10 ENCOUNTER — Other Ambulatory Visit: Payer: Self-pay

## 2023-08-10 DIAGNOSIS — Z3009 Encounter for other general counseling and advice on contraception: Secondary | ICD-10-CM

## 2024-01-02 ENCOUNTER — Encounter: Payer: Self-pay | Admitting: Radiology

## 2024-03-13 ENCOUNTER — Ambulatory Visit: Payer: Self-pay | Admitting: Family Medicine

## 2024-03-13 ENCOUNTER — Encounter: Payer: Self-pay | Admitting: Family Medicine

## 2024-03-13 ENCOUNTER — Ambulatory Visit: Payer: No Typology Code available for payment source | Admitting: Family Medicine

## 2024-03-13 VITALS — BP 110/82 | HR 71 | Temp 97.8°F | Ht 73.0 in | Wt 234.6 lb

## 2024-03-13 DIAGNOSIS — Z114 Encounter for screening for human immunodeficiency virus [HIV]: Secondary | ICD-10-CM

## 2024-03-13 DIAGNOSIS — E669 Obesity, unspecified: Secondary | ICD-10-CM

## 2024-03-13 DIAGNOSIS — F3181 Bipolar II disorder: Secondary | ICD-10-CM | POA: Diagnosis not present

## 2024-03-13 DIAGNOSIS — Z13220 Encounter for screening for lipoid disorders: Secondary | ICD-10-CM | POA: Diagnosis not present

## 2024-03-13 DIAGNOSIS — Z131 Encounter for screening for diabetes mellitus: Secondary | ICD-10-CM | POA: Diagnosis not present

## 2024-03-13 DIAGNOSIS — Z23 Encounter for immunization: Secondary | ICD-10-CM

## 2024-03-13 DIAGNOSIS — Z0000 Encounter for general adult medical examination without abnormal findings: Secondary | ICD-10-CM | POA: Diagnosis not present

## 2024-03-13 DIAGNOSIS — Z113 Encounter for screening for infections with a predominantly sexual mode of transmission: Secondary | ICD-10-CM

## 2024-03-13 DIAGNOSIS — Z118 Encounter for screening for other infectious and parasitic diseases: Secondary | ICD-10-CM

## 2024-03-13 DIAGNOSIS — E559 Vitamin D deficiency, unspecified: Secondary | ICD-10-CM

## 2024-03-13 LAB — CBC WITH DIFFERENTIAL/PLATELET
Basophils Absolute: 0 K/uL (ref 0.0–0.1)
Basophils Relative: 0.9 % (ref 0.0–3.0)
Eosinophils Absolute: 0.3 K/uL (ref 0.0–0.7)
Eosinophils Relative: 10.7 % — ABNORMAL HIGH (ref 0.0–5.0)
HCT: 47.7 % (ref 39.0–52.0)
Hemoglobin: 16.1 g/dL (ref 13.0–17.0)
Lymphocytes Relative: 48.3 % — ABNORMAL HIGH (ref 12.0–46.0)
Lymphs Abs: 1.4 K/uL (ref 0.7–4.0)
MCHC: 33.7 g/dL (ref 30.0–36.0)
MCV: 84.9 fl (ref 78.0–100.0)
Monocytes Absolute: 0.3 K/uL (ref 0.1–1.0)
Monocytes Relative: 9.6 % (ref 3.0–12.0)
Neutro Abs: 0.9 K/uL — ABNORMAL LOW (ref 1.4–7.7)
Neutrophils Relative %: 30.5 % — ABNORMAL LOW (ref 43.0–77.0)
Platelets: 257 K/uL (ref 150.0–400.0)
RBC: 5.62 Mil/uL (ref 4.22–5.81)
RDW: 13.2 % (ref 11.5–15.5)
WBC: 2.9 K/uL — ABNORMAL LOW (ref 4.0–10.5)

## 2024-03-13 LAB — COMPREHENSIVE METABOLIC PANEL WITH GFR
ALT: 53 U/L (ref 3–53)
AST: 25 U/L (ref 5–37)
Albumin: 4.3 g/dL (ref 3.5–5.2)
Alkaline Phosphatase: 100 U/L (ref 39–117)
BUN: 19 mg/dL (ref 6–23)
CO2: 28 meq/L (ref 19–32)
Calcium: 9.2 mg/dL (ref 8.4–10.5)
Chloride: 105 meq/L (ref 96–112)
Creatinine, Ser: 1.29 mg/dL (ref 0.40–1.50)
GFR: 77.16 mL/min
Glucose, Bld: 71 mg/dL (ref 70–99)
Potassium: 4.4 meq/L (ref 3.5–5.1)
Sodium: 140 meq/L (ref 135–145)
Total Bilirubin: 0.4 mg/dL (ref 0.2–1.2)
Total Protein: 6.9 g/dL (ref 6.0–8.3)

## 2024-03-13 LAB — LIPID PANEL
Cholesterol: 174 mg/dL (ref 28–200)
HDL: 44.7 mg/dL
LDL Cholesterol: 106 mg/dL — ABNORMAL HIGH (ref 10–99)
NonHDL: 128.96
Total CHOL/HDL Ratio: 4
Triglycerides: 114 mg/dL (ref 10.0–149.0)
VLDL: 22.8 mg/dL (ref 0.0–40.0)

## 2024-03-13 LAB — HEMOGLOBIN A1C: Hgb A1c MFr Bld: 5.3 % (ref 4.6–6.5)

## 2024-03-13 LAB — VITAMIN D 25 HYDROXY (VIT D DEFICIENCY, FRACTURES): VITD: <7 ng/mL — ABNORMAL LOW (ref 30.00–100.00)

## 2024-03-13 MED ORDER — VITAMIN D (ERGOCALCIFEROL) 1.25 MG (50000 UNIT) PO CAPS: 50000.0000 [IU] | ORAL_CAPSULE | ORAL | 1 refills | Status: AC

## 2024-03-13 NOTE — Patient Instructions (Addendum)
 Please stop by lab before you go If you have mychart- we will send your results within 3 business days of us  receiving them.  If you do not have mychart- we will call you about results within 5 business days of us  receiving them.  *please also note that you will see labs on mychart as soon as they post. I will later go in and write notes on them- will say notes from Dr. Katrinka   Your vitamin D  is low and suspect off medicine still very low. I have sent in high dose vitamin D  for you to take for 13 weeks and then refill again. After you complete high dose, please take 2000 units per day long term- can go ahead and pick up today  Lets work on getting weight back down- you've done a great job on this in the past  Recommended follow up: Return in about 1 year (around 03/13/2025) for physical or sooner if needed.Schedule b4 you leave.

## 2024-03-13 NOTE — Progress Notes (Signed)
 " Phone: [phone]    Subjective:  Patient presents today for their annual physical. Chief complaint-noted.   See problem oriented charting- ROS- full  review of systems was completed and negative  except for topics noted under acute/chronic concerns  The following were reviewed and entered/updated in epic: Past Medical History:  Diagnosis Date   ADHD    diagnosed by Pediatrician [facility]. record in epic. Vyvanse  70mg  but also on anxiety medicine and using marijuana- likely should be prescribed by psychiatry   Allergic rhinitis    zyrtec and singulair    Anxiety    [facility] referred to peds psychiatyr. he reports vsiit 07/22/16 planned. buspirone    Back pain    Bipolar 1 disorder (HCC)    Depression    Electronic cigarette use    advised against using these   GERD (gastroesophageal reflux disease)    prilosec 20mg    Joint pain    SOB (shortness of breath)    Stomach ulcer    Patient Active Problem List   Diagnosis Date Noted   Bipolar II disorder (HCC) 07/25/2016    Priority: High   ADHD     Priority: High   GERD (gastroesophageal reflux disease)     Priority: Medium    Electronic cigarette use     Priority: Medium    Anxiety     Priority: Medium    Vitamin D  deficiency 09/22/2020    Priority: Low   SOB (shortness of breath) 08/21/2020   Allergic rhinitis 06/21/2018   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 No pertinent surgical history.  Family History  Problem Relation Age of Onset   Hyperlipidemia Mother    Depression Mother    Anxiety disorder Mother    Obesity Mother    Obesity Father    Anxiety disorder Father    Depression Father    Cancer Father    Stroke Father    Hyperlipidemia Father    Hypertension Father    Kidney disease Father        renal transplant   Heart disease Father        CAD age 22   Diabetes Father    Benign prostatic hyperplasia Father    Bipolar disorder Father    Sleep apnea  Father    Healthy Sister    Healthy Sister    Healthy Brother    Cancer Maternal Grandmother        ? stomach   Other Maternal Grandfather        unknown- never met   Dementia Paternal Grandmother    Kidney disease Paternal Grandfather    Cancer Maternal Uncle        ? colon   Lung cancer Paternal Aunt        smoker   Kidney disease Paternal Uncle        dialysis    Medications- reviewed and updated Current Outpatient Medications  Medication Sig Dispense Refill   hydrOXYzine (VISTARIL) 25 MG capsule Take 25 mg by mouth 3 (three) times daily.     lamoTRIgine (LAMICTAL) 200 MG tablet Take 150 mg by mouth 2 (two) times daily.     pramipexole (MIRAPEX) 1 MG tablet      pregabalin (LYRICA) 75 MG capsule Take 75 mg by mouth 2 (two) times daily.     Vitamin D , Ergocalciferol , (DRISDOL ) 1.25 MG (50000 UNIT) CAPS capsule Take 1 capsule (50,000 Units total) by mouth every 7 (seven) days. 13 capsule 1  No current facility-administered medications for this visit.    Allergies-reviewed and updated Allergies[1]  Social History   Social History Narrative   Single. Lives with mom.       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       Hobbies: basketball, music      Objective:  BP 110/82 (BP Location: Left Arm, Patient Position: Sitting, Cuff Size: Normal)   Pulse 71   Temp 97.8 F (36.6 C) (Temporal)   Ht 6' 1 (1.854 m)   Wt 234 lb 9.6 oz (106.4 kg)   SpO2 95%   BMI 30.95 kg/m  Gen: NAD, resting comfortably HEENT: Mucous membranes are moist. Oropharynx normal Neck: no thyromegaly CV: RRR no murmurs rubs or gallops Lungs: CTAB no crackles, wheeze, rhonchi Abdomen: soft/nontender/nondistended/normal bowel sounds. No rebound or guarding.  Ext: no edema Skin: warm, dry Neuro: grossly normal, moves all extremities, PERRLA    Assessment and Plan:  24 y.o. male presenting for annual physical.   Health Maintenance counseling: 1. Anticipatory guidance: Patient counseled regarding regular dental exams -advised sto schedule, q6 months, eye exams - yearly for contacts-just had within 2 weeks,  avoiding smoking and second hand smoke- e cigarettes advised cessation- has pulled off of all cigarettes , limiting alcohol to 2 beverages per day- not drinking, no illicit drugs- some marijuana- formally advised against- sometimes just uses CBD.   2. Risk factor reduction:  Advised patient of need for regular exercise and diet rich and fruits and vegetables to reduce risk of heart attack and stroke.  Exercise- down lately- usually 15k steps with work when busy or more- may do some longboarding and play some bsketball.  Diet/weight management-weight up 34 lbs - stress eating and work lower in winter months so less active. Several stressors in September and October and never really had time to process- starting to process now (working on this with therapist). He's starting to reverse this trend. .  Wt Readings from Last 3 Encounters:  03/13/24 234 lb 9.6 oz (106.4 kg)  03/10/23 200 lb 6.4 oz (90.9 kg)  03/08/22 195 lb 3.2 oz (88.5 kg)  3. Immunizations/screenings/ancillary studies-flu shot declines  Immunization History  Administered Date(s) Administered   19-influenza Whole 12/20/2014   DTaP 01/24/1999, 03/13/1999, 05/29/1999, 05/10/2000, 11/20/2002   DTaP / HiB 01/24/1999, 03/13/1999, 05/29/1999, 11/13/1999, 05/10/2000, 11/20/2002   Fluzone Influenza virus vaccine,trivalent (IIV3), split virus 12/20/2014   HIB (PRP-T) 01/24/1999, 03/13/1999, 05/29/1999, 11/13/1999   HPV Quadrivalent 01/07/2011, 01/12/2012, 07/07/2012   Hepatitis A 11/15/2005, 11/21/2006   Hepatitis A, Ped/Adol-2 Dose 11/15/2005, 11/21/2006   Hepatitis B 09/28/1998, 12/16/1998, 08/12/1999   IPV 01/24/1999, 03/13/1999, 05/29/1999, 11/20/2002   Influenza,inj,Quad PF,6+ Mos 12/20/2014, 12/17/2015   MMR 11/13/1999, 11/20/2002    Meningococcal Conjugate 07/07/2010, 12/20/2014   Meningococcal Mcv4o 07/07/2010, 12/20/2014   PFIZER(Purple Top)SARS-COV-2 Vaccination 07/18/2019, 10/08/2019   Pneumococcal Conjugate PCV 7 11/15/2001   Pneumococcal Conjugate-13 11/15/2001   Tdap 11/25/2008, 10/05/2016   Varicella 11/13/1999, 08/06/2005  4. Prostate cancer screening- father with prostate cancer in 65s- we will screen at 46 for him   5. Colon cancer screening - no family history, start at age 71  6. Skin cancer screening/prevention- lower risk due to melanin content. advised regular sunscreen use. Denies worrisome, changing, or new skin lesions.  7. Testicular cancer screening- advised monthly self exams  8. STD screening- patient opts in- monogamous with 1 partner-  not always using protection- declines STD screening today 9. Smoking associated screening- former smoker- e cigarettes- advised against once again  Status of chronic or acute concerns   # Bipolar and ADD-continues to work with psychiatry-currently on Lamictal 100 mg twice daily, Mirapex 1 mg, Lyrica 75 mg twice daily and hydroxyzine 25 mg 3 times a day. Reasonable control. Not on anything for attention deficit disorder right now  # Elective sterilization-saw urology August 2025-he reports did not end up going through with this last year but may still do it this year when reaches deductible   #screening hyperlipidemia- mild elevations- would like to repeat today Lab Results  Component Value Date   CHOL 176 03/14/2023   HDL 45.50 03/14/2023   LDLCALC 120 (H) 03/14/2023   TRIG 51.0 03/14/2023   CHOLHDL 4 03/14/2023   #Vitamin D  deficiency S: Medication: High-dose vitamin D  for 6 months recommended last year-hes not sure if every took it  Last vitamin D  Lab Results  Component Value Date   VD25OH 9.72 (L) 03/14/2023   A/P: suspect poor control - he's had some fatigue- could contribute- update levels and likely resend .   # Chronically low white blood  count-could be genetic.SABRA  HIV has been negative.  #still dealing with joint pain - insoles and new shoes helped. Saw orthopedic- they said could D physical therapy. Some worsening lately- may get new shoes and new insoles again  Recommended follow up: Return in about 1 year (around 03/13/2025) for physical or sooner if needed.Schedule b4 you leave.  Lab/Order associations:NOT fasting   ICD-10-CM   1. Preventative health care  Z00.00     2. Immunization due  Z23 CANCELED: Flu vaccine trivalent PF, 6mos and older(Flulaval,Afluria,Fluarix,Fluzone)    3. Screening for gonorrhea  Z11.3     4. Screening for chlamydial disease  Z11.8     5. Screen for STD (sexually transmitted disease)  Z11.3     6. Vitamin D  deficiency  E55.9 VITAMIN D  25 Hydroxy (Vit-D Deficiency, Fractures)    7. Screening for hyperlipidemia  Z13.220 Lipid panel    Comprehensive metabolic panel    8. Bipolar II disorder (HCC)  F31.81 CBC with Differential/Platelet    9. Screening examination for venereal disease  Z11.3     10. Screening for HIV (human immunodeficiency virus)  Z11.4     11. Screening for diabetes mellitus  Z13.1 Hemoglobin A1c    12. Obesity (BMI 30-39.9)  E66.9 Hemoglobin A1c      Meds ordered this encounter  Medications   Vitamin D , Ergocalciferol , (DRISDOL ) 1.25 MG (50000 UNIT) CAPS capsule    Sig: Take 1 capsule (50,000 Units total) by mouth every 7 (seven) days.    Dispense:  13 capsule    Refill:  1    Return precautions advised.   Garnette Lukes, MD      [1]  Allergies Allergen Reactions   Abilify  [Aripiprazole ]     Facial twitching, muscle stiffness, insomnia   "

## 2025-03-14 ENCOUNTER — Encounter: Admitting: Family Medicine
